# Patient Record
Sex: Female | Born: 1980 | Race: Black or African American | Hispanic: No | Marital: Single | Smoking: Current every day smoker
Health system: Southern US, Community
[De-identification: ages and names within clinical notes are randomized; demographics above are authoritative.]

## PROBLEM LIST (undated history)

## (undated) DIAGNOSIS — R079 Chest pain, unspecified: Secondary | ICD-10-CM

## (undated) DIAGNOSIS — E039 Hypothyroidism, unspecified: Secondary | ICD-10-CM

## (undated) DIAGNOSIS — F32A Depression, unspecified: Secondary | ICD-10-CM

## (undated) DIAGNOSIS — T7840XA Allergy, unspecified, initial encounter: Secondary | ICD-10-CM

## (undated) DIAGNOSIS — E079 Disorder of thyroid, unspecified: Secondary | ICD-10-CM

## (undated) DIAGNOSIS — N189 Chronic kidney disease, unspecified: Secondary | ICD-10-CM

## (undated) DIAGNOSIS — J45909 Unspecified asthma, uncomplicated: Secondary | ICD-10-CM

## (undated) DIAGNOSIS — E785 Hyperlipidemia, unspecified: Secondary | ICD-10-CM

## (undated) HISTORY — DX: Hyperlipidemia, unspecified: E78.5

## (undated) HISTORY — DX: Allergy, unspecified, initial encounter: T78.40XA

## (undated) HISTORY — DX: Chronic kidney disease, unspecified: N18.9

## (undated) HISTORY — DX: Hypothyroidism, unspecified: E03.9

## (undated) HISTORY — PX: TUBAL LIGATION: SHX77

## (undated) HISTORY — DX: Depression, unspecified: F32.A

## (undated) HISTORY — DX: Chest pain, unspecified: R07.9

---

## 2008-05-15 DIAGNOSIS — E079 Disorder of thyroid, unspecified: Secondary | ICD-10-CM | POA: Insufficient documentation

## 2017-10-20 ENCOUNTER — Ambulatory Visit: Payer: Self-pay | Admitting: Nurse Practitioner

## 2017-10-20 ENCOUNTER — Encounter: Payer: Self-pay | Admitting: Nurse Practitioner

## 2017-10-20 VITALS — BP 100/64 | HR 85 | Temp 98.3°F | Wt 128.6 lb

## 2017-10-20 DIAGNOSIS — N926 Irregular menstruation, unspecified: Secondary | ICD-10-CM

## 2017-10-20 DIAGNOSIS — Z760 Encounter for issue of repeat prescription: Secondary | ICD-10-CM

## 2017-10-20 MED ORDER — BUDESONIDE-FORMOTEROL FUMARATE 160-4.5 MCG/ACT IN AERO: 2.0000 | INHALATION_SPRAY | Freq: Two times a day (BID) | RESPIRATORY_TRACT | 2 refills | Status: DC

## 2017-10-20 MED ORDER — ALBUTEROL SULFATE HFA 108 (90 BASE) MCG/ACT IN AERS: 2.0000 | INHALATION_SPRAY | Freq: Four times a day (QID) | RESPIRATORY_TRACT | 2 refills | Status: DC | PRN

## 2017-10-20 NOTE — Patient Instructions (Addendum)

## 2017-10-20 NOTE — Progress Notes (Signed)
Asthma: Patient presents for evaluation of wheezing.  The patient has been previously diagnosed with asthma. Symptoms currently include wheezing and at night per patient and occur daily.  Observed precipitants include no identifiable factor.  Current limitations in activity from asthma: none.  Number of days of school or work missed in the last month: not applicable. Patient states she moved to the area 8 months ago and had not found a PCP at this time.  Patient presents for refills on maintenance medication and rescue inhaler.  Patient has not had any recent hospitalizations.   Does she do nebulizer treatments? yes Does she use an inhaler? yes Does she use a spacer w/MDIs? no Does she monitor peak flow rates? no  What is her personal best peak flow rate: n/a   Amenorrhea: Patient complains of amenorrhea. Currently periods are occurring irregularly.  Bleeding is moderate.  Patient states she missed her cycle for February, but had a period during the entire month of January.  Patient has no relevant history of abnormal sexual development. Is there a chance of pregnancy? yes   Factors that may be contributory to menstrual abnormalities include recent stressors and history of thyroid disease..   Review of Systems  Constitutional: Negative.   HENT: Negative.   Eyes: Negative.   Respiratory: Positive for wheezing. Negative for cough and shortness of breath.   Cardiovascular: Negative.   Gastrointestinal: Negative.        Amenorrhea for February  Genitourinary: Negative.   Skin: Negative.     Physical Exam  Constitutional: She is oriented to person, place, and time. She appears well-developed and well-nourished.  HENT:  Head: Normocephalic and atraumatic.  Eyes: Conjunctivae and EOM are normal. Pupils are equal, round, and reactive to light.  Neck: Normal range of motion. Neck supple. No tracheal deviation present. No thyromegaly present.  Cardiovascular: Normal rate, regular rhythm and normal  heart sounds.  Pulmonary/Chest: Effort normal and breath sounds normal. No respiratory distress. She has no wheezes.  Abdominal: Soft. Bowel sounds are normal. She exhibits no distension. There is no tenderness.  Neurological: She is alert and oriented to person, place, and time.  Skin: Skin is warm and dry.  Psychiatric: She has a normal mood and affect. Her behavior is normal. Judgment and thought content normal.    Assessment   1. Asthma- Medication Refill Meds ordered this encounter  Medications  . albuterol (PROVENTIL HFA;VENTOLIN HFA) 108 (90 Base) MCG/ACT inhaler    Sig: Inhale 2 puffs into the lungs every 6 (six) hours as needed for wheezing or shortness of breath.    Dispense:  1 Inhaler    Refill:  2    Order Specific Question:   Supervising Provider    Answer:   Stacie GlazeJENKINS, JOHN E [[id number]]  . budesonide-formoterol (SYMBICORT) 160-4.5 MCG/ACT inhaler    Sig: Inhale 2 puffs into the lungs 2 (two) times daily.    Dispense:  1 Inhaler    Refill:  2    Order Specific Question:   Supervising Provider    Answer:   Stacie GlazeJENKINS, JOHN E [[id number]]  Refill on asthma medications.  Patient instructed to establish with PCP for asthma action plan and thyroid disease, hyperlipidemia.    2.  Amenorrhea Urine preg-negative.  Patient instructed to follow up with GYN if menses becomes irregular.  Follow up as needed.

## 2017-11-05 ENCOUNTER — Encounter (HOSPITAL_COMMUNITY): Payer: Self-pay

## 2017-11-05 ENCOUNTER — Emergency Department (HOSPITAL_COMMUNITY): Payer: Self-pay

## 2017-11-05 ENCOUNTER — Emergency Department (HOSPITAL_COMMUNITY)
Admission: EM | Admit: 2017-11-05 | Discharge: 2017-11-05 | Disposition: A | Discharge status: Home / Self Care | Payer: Self-pay | Attending: Emergency Medicine | Admitting: Emergency Medicine

## 2017-11-05 ENCOUNTER — Other Ambulatory Visit: Payer: Self-pay

## 2017-11-05 DIAGNOSIS — R062 Wheezing: Secondary | ICD-10-CM | POA: Insufficient documentation

## 2017-11-05 DIAGNOSIS — E039 Hypothyroidism, unspecified: Secondary | ICD-10-CM | POA: Insufficient documentation

## 2017-11-05 DIAGNOSIS — R0789 Other chest pain: Secondary | ICD-10-CM | POA: Insufficient documentation

## 2017-11-05 DIAGNOSIS — J45909 Unspecified asthma, uncomplicated: Secondary | ICD-10-CM | POA: Insufficient documentation

## 2017-11-05 DIAGNOSIS — F17200 Nicotine dependence, unspecified, uncomplicated: Secondary | ICD-10-CM | POA: Insufficient documentation

## 2017-11-05 DIAGNOSIS — Z720 Tobacco use: Secondary | ICD-10-CM

## 2017-11-05 DIAGNOSIS — Z79899 Other long term (current) drug therapy: Secondary | ICD-10-CM | POA: Insufficient documentation

## 2017-11-05 HISTORY — DX: Disorder of thyroid, unspecified: E07.9

## 2017-11-05 HISTORY — DX: Unspecified asthma, uncomplicated: J45.909

## 2017-11-05 MED ORDER — PREDNISONE 10 MG (21) PO TBPK: ORAL_TABLET | Freq: Every day | ORAL | 0 refills | Status: DC

## 2017-11-05 MED ORDER — ALBUTEROL SULFATE HFA 108 (90 BASE) MCG/ACT IN AERS
2.0000 | INHALATION_SPRAY | Freq: Once | RESPIRATORY_TRACT | Status: AC
  Administered 2017-11-05: 2 via RESPIRATORY_TRACT
  Filled 2017-11-05: qty 6.7

## 2017-11-05 MED ORDER — LEVOCETIRIZINE DIHYDROCHLORIDE 5 MG PO TABS: 5.0000 mg | ORAL_TABLET | Freq: Every evening | ORAL | 0 refills | Status: DC

## 2017-11-05 MED ORDER — IPRATROPIUM-ALBUTEROL 0.5-2.5 (3) MG/3ML IN SOLN
3.0000 mL | Freq: Once | RESPIRATORY_TRACT | Status: AC
  Administered 2017-11-05: 3 mL via RESPIRATORY_TRACT
  Filled 2017-11-05: qty 3

## 2017-11-05 NOTE — ED Triage Notes (Signed)
PT reports hx of asthma and feeling more sob over the last 2 weeks. Pt reports she went to [facility] to get [facility] on 2 inhalers but was only able to get rescue inhaler and is now out of that. NAD VSS

## 2017-11-05 NOTE — ED Provider Notes (Signed)
[facility] EMERGENCY DEPARTMENT Provider Note   CSN: [id number] Arrival date & time: 11/05/17  1314     History   Chief Complaint Chief Complaint  Patient presents with  . Asthma    HPI Tammy Miller is a 37 y.o. female.  Who presents emergency.  Patient is uninsured and new to town.  [facility] because she is been having worsening coughing, wheezing, chest tightness.  She used her entire albuterol inhaler between her visit 2 weeks ago and now.  She states that she does have seasonal allergies.  She admits to being a daily smoker.  She used to be on Symbicort when she was insured.  She denies fevers or chills  HPI  Past Medical History:  Diagnosis Date  . Asthma   . Thyroid disease     There are no active problems to display for this patient.   Past Surgical History:  Procedure Laterality Date  . CESAREAN SECTION    . TUBAL LIGATION      OB History    No data available       Home Medications    Prior to Admission medications   Medication Sig Start Date End Date Taking? Authorizing Provider  albuterol (PROVENTIL HFA;VENTOLIN HFA) 108 (90 Base) MCG/ACT inhaler Inhale 2 puffs into the lungs every 6 (six) hours as needed for wheezing or shortness of breath. 10/20/17 11/19/17 Yes Benay Pike, NP  budesonide-formoterol (SYMBICORT) 160-4.5 MCG/ACT inhaler Inhale 2 puffs into the lungs 2 (two) times daily. 10/20/17 11/19/17 Yes Benay Pike, NP  cetirizine (ZYRTEC) 10 MG tablet Take 10 mg by mouth daily.   Yes [provider]  montelukast (SINGULAIR) 10 MG tablet Take 10 mg by mouth at bedtime.   Yes [provider]  QUEtiapine (SEROQUEL) 100 MG tablet Take 100 mg by mouth at bedtime.   Yes [provider]  rosuvastatin (CRESTOR) 5 MG tablet Take 5 mg by mouth daily.   Yes [provider]  thyroid (ARMOUR) 120 MG tablet Take 120 mg by mouth daily before breakfast.   Yes  [provider]  levocetirizine (XYZAL) 5 MG tablet Take 1 tablet (5 mg total) by mouth every evening. 11/05/17   Vonzella Althaus, PA-C  predniSONE (STERAPRED UNI-PAK 21 TAB) 10 MG (21) TBPK tablet Take by mouth daily. Take 6 tabs by mouth daily  for 2 days, then 5 tabs for 2 days, then 4 tabs for 2 days, then 3 tabs for 2 days, 2 tabs for 2 days, then 1 tab by mouth daily for 2 days 11/05/17   Arthor Captain, PA-C    Family History No family history on file.  Social History Social History   Tobacco Use  . Smoking status: Current Every Day Smoker  . Smokeless tobacco: Never Used  Substance Use Topics  . Alcohol use: No    Frequency: Never  . Drug use: No     Allergies   Fish allergy; Aspirin; and Penicillins   Review of Systems Review of Systems  Ten systems reviewed and are negative for acute change, except as noted in the HPI.   Physical Exam Updated Vital Signs BP 115/86 (BP Location: Left Arm)   Pulse 93   Temp 98 F (36.7 C) (Oral)   Resp 15   Ht 5\' 4"  (1.626 m)   Wt 58.1 kg (128 lb)   LMP 10/24/2017   SpO2 100%   BMI 21.97 kg/m  Physical Exam  Constitutional: She is oriented to person, place, and time. She appears well-developed and well-nourished. No distress.  HENT:  Head: Normocephalic and atraumatic.  Eyes: Conjunctivae are normal. No scleral icterus.  Neck: Normal range of motion.  Cardiovascular: Normal rate, regular rhythm and normal heart sounds. Exam reveals no gallop and no friction rub.  No murmur heard. Pulmonary/Chest: Effort normal and breath sounds normal. No respiratory distress.  Abdominal: Soft. Bowel sounds are normal. She exhibits no distension and no mass. There is no tenderness. There is no guarding.  Neurological: She is alert and oriented to person, place, and time.  Skin: Skin is warm and dry. She is not diaphoretic.  Psychiatric: Her behavior is normal.  Nursing note and vitals reviewed.    ED Treatments / Results    Labs (all labs ordered are listed, but only abnormal results are displayed) Labs Reviewed - No data to display  EKG  EKG Interpretation None       Radiology Dg Chest 2 View  Result Date: 11/05/2017 CLINICAL DATA:  Shortness of breath and cough for 2 days. EXAM: CHEST - 2 VIEW COMPARISON:  None. FINDINGS: The cardiac silhouette, mediastinal and hilar contours are within normal limits. There is mild tortuosity of the thoracic aorta. The lungs are clear of acute process. No worrisome pulmonary lesions. No pleural effusion. The bony thorax is intact. IMPRESSION: No acute cardiopulmonary findings. Electronically Signed   By: Rudie MeyerP.  Gallerani M.D.   On: 11/05/2017 14:26    Procedures Procedures (including critical care time)  Medications Ordered in ED Medications  albuterol (PROVENTIL HFA;VENTOLIN HFA) 108 (90 Base) MCG/ACT inhaler 2 puff (not administered)  ipratropium-albuterol (DUONEB) 0.5-2.5 (3) MG/3ML nebulizer solution 3 mL (3 mLs Nebulization Given 11/05/17 1513)     Initial Impression / Assessment and Plan / ED Course  I have reviewed the triage vital signs and the nursing notes.  Pertinent labs & imaging results that were available during my care of the patient were reviewed by me and considered in my medical decision making (see chart for details).     Patient with asthma.  She was given a DuoNeb treatment prior to my evaluation and there is no wheezing on exam at this time.  Patient was discharged with a prednisone Dosepak, albuterol inhaler and Xyzal for allergy treatment.  She appears appropriate for discharge at this time should follow-up with outpatient primary care physician.  Discussed return precautions.  . The patient was counseled on the dangers of tobacco use, and was advised to quit.  Reviewed strategies to maximize success, including removing cigarettes and smoking materials from environment, stress management, substitution of other forms of reinforcement, support  of family/friends and written materials.  however he thinks  Final Clinical Impressions(s) / ED Diagnoses   Final diagnoses:  Wheezing    ED Discharge Orders        Ordered    predniSONE (STERAPRED UNI-PAK 21 TAB) 10 MG (21) TBPK tablet  Daily     11/05/17 1619    levocetirizine (XYZAL) 5 MG tablet  Every evening     11/05/17 1620       Arthor CaptainHarris, Seichi Kaufhold, PA-C 11/05/17 1651    Raeford RazorKohut, Stephen, MD 11/05/17 [phone]

## 2017-11-05 NOTE — ED Notes (Signed)
Declined W/C at D/C and was escorted to lobby by RN. 

## 2017-11-05 NOTE — Discharge Instructions (Signed)
Get help right away if: °You seem to be getting worse and are unresponsive to treatment during an asthma attack. °You are short of breath even at rest. °You get short of breath when doing very little physical activity. °You have difficulty eating, drinking, or talking due to asthma symptoms. °You develop chest pain. °You develop a fast heartbeat. °You have a bluish color to your lips or fingernails. °You are light-headed, dizzy, or faint. °Your peak flow is less than 50% of your personal best. °

## 2018-01-30 ENCOUNTER — Other Ambulatory Visit: Payer: Self-pay | Admitting: Nurse Practitioner

## 2018-07-19 IMAGING — CR DG CHEST 2V
2 series · 2 of 2 positions shown · non-contrast
Comparison: None.

CLINICAL DATA: Shortness of breath and cough for 2 days.

EXAM:
CHEST - 2 VIEW

[chest pa]
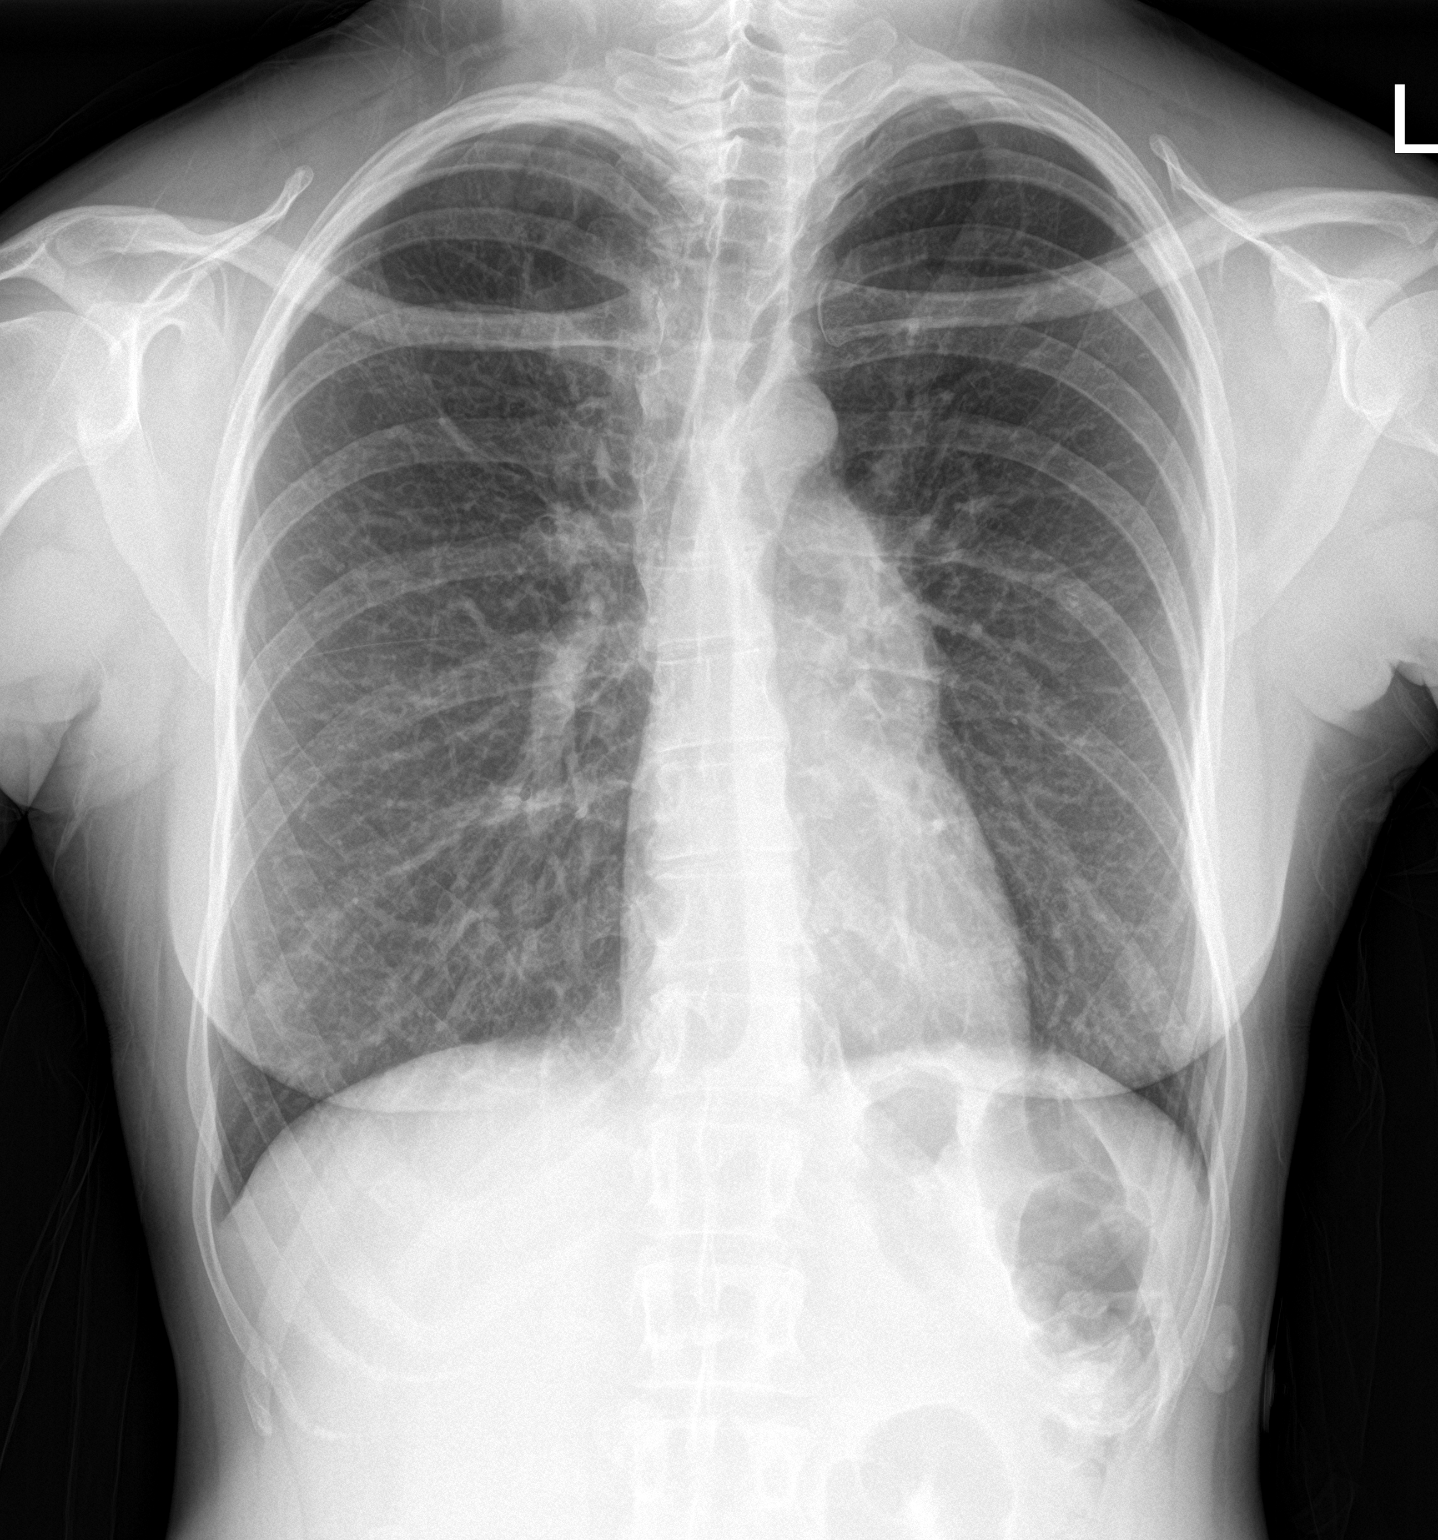

[chest lat]
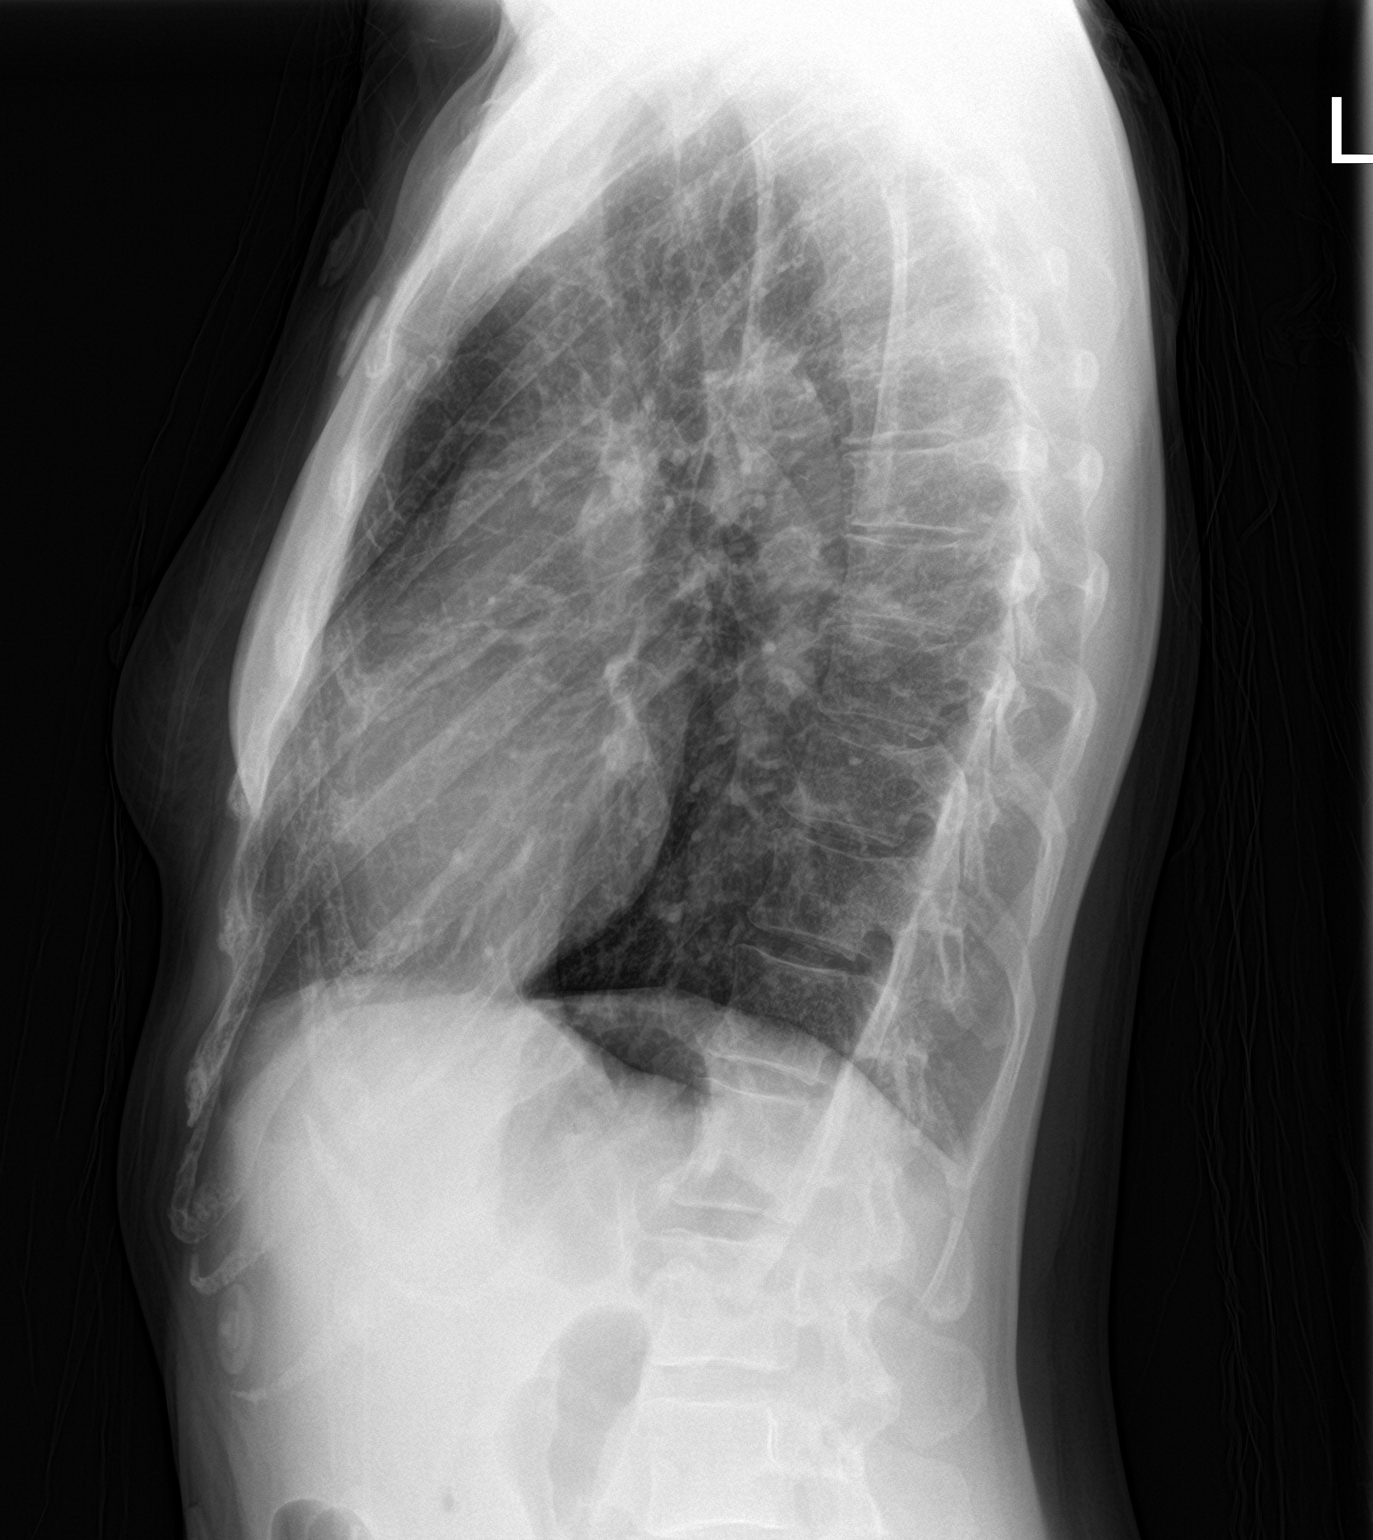

[2 of 2 positions shown; findings below may reference images not displayed]

FINDINGS: The cardiac silhouette, mediastinal and hilar contours are within
normal limits. There is mild tortuosity of the thoracic aorta. The
lungs are clear of acute process. No worrisome pulmonary lesions. No
pleural effusion. The bony thorax is intact.
IMPRESSION: No acute cardiopulmonary findings.

## 2019-12-04 ENCOUNTER — Emergency Department (HOSPITAL_COMMUNITY)
Admission: EM | Admit: 2019-12-04 | Discharge: 2019-12-04 | Disposition: A | Discharge status: Home / Self Care | Payer: Self-pay | Attending: Emergency Medicine | Admitting: Emergency Medicine

## 2019-12-04 ENCOUNTER — Encounter (HOSPITAL_COMMUNITY): Payer: Self-pay

## 2019-12-04 ENCOUNTER — Other Ambulatory Visit: Payer: Self-pay

## 2019-12-04 DIAGNOSIS — J45909 Unspecified asthma, uncomplicated: Secondary | ICD-10-CM | POA: Insufficient documentation

## 2019-12-04 DIAGNOSIS — K0889 Other specified disorders of teeth and supporting structures: Secondary | ICD-10-CM | POA: Insufficient documentation

## 2019-12-04 DIAGNOSIS — F17200 Nicotine dependence, unspecified, uncomplicated: Secondary | ICD-10-CM | POA: Insufficient documentation

## 2019-12-04 MED ORDER — CLINDAMYCIN HCL 150 MG PO CAPS: 150.0000 mg | ORAL_CAPSULE | Freq: Four times a day (QID) | ORAL | 0 refills | Status: DC

## 2019-12-04 MED ORDER — NAPROXEN 375 MG PO TABS: 375.0000 mg | ORAL_TABLET | Freq: Two times a day (BID) | ORAL | 0 refills | Status: DC | PRN

## 2019-12-04 NOTE — ED Triage Notes (Signed)
Pt reports left upper dental pain with swelling for the past 3 days.

## 2019-12-11 NOTE — ED Provider Notes (Signed)
[facility] EMERGENCY DEPARTMENT Provider Note   CSN: [id number] Arrival date & time: 12/04/19  2440     History Chief Complaint  Patient presents with  . Dental Pain    Tammy Miller is a 39 y.o. female.  HPI   39 year old female with facial/dental pain.  And aggravating her about 3 days ago.  Denies any trauma.  Persistent pain in her upper mouth going towards her left eye.  Mild swelling.  She says she knows she needs to see a dentist but is having some financial difficulties.  No fevers or chills.  No difficulty with breathing or swallowing.  Past Medical History:  Diagnosis Date  . Asthma   . Thyroid disease     There are no problems to display for this patient.   Past Surgical History:  Procedure Laterality Date  . CESAREAN SECTION    . TUBAL LIGATION       OB History   No obstetric history on file.     No family history on file.  Social History   Tobacco Use  . Smoking status: Current Every Day Smoker  . Smokeless tobacco: Never Used  Substance Use Topics  . Alcohol use: No  . Drug use: No    Home Medications Prior to Admission medications   Medication Sig Start Date End Date Taking? Authorizing Provider  albuterol (PROVENTIL HFA;VENTOLIN HFA) 108 (90 Base) MCG/ACT inhaler Inhale 2 puffs into the lungs every 6 (six) hours as needed for wheezing or shortness of breath. 10/20/17 11/19/17  Kara Dies, NP  budesonide-formoterol (SYMBICORT) 160-4.5 MCG/ACT inhaler Inhale 2 puffs into the lungs 2 (two) times daily. 10/20/17 11/19/17  Kara Dies, NP  cetirizine (ZYRTEC) 10 MG tablet Take 10 mg by mouth daily.    [provider]  clindamycin (CLEOCIN) 150 MG capsule Take 1 capsule (150 mg total) by mouth 4 (four) times daily. 12/04/19   Virgel Manifold, MD  levocetirizine (XYZAL) 5 MG tablet Take 1 tablet (5 mg total) by mouth every evening. 11/05/17   Harris, Abigail, PA-C  montelukast (SINGULAIR) 10 MG tablet Take  10 mg by mouth at bedtime.    [provider]  naproxen (NAPROSYN) 375 MG tablet Take 1 tablet (375 mg total) by mouth 2 (two) times daily as needed. 12/04/19   Virgel Manifold, MD  predniSONE (STERAPRED UNI-PAK 21 TAB) 10 MG (21) TBPK tablet Take by mouth daily. Take 6 tabs by mouth daily  for 2 days, then 5 tabs for 2 days, then 4 tabs for 2 days, then 3 tabs for 2 days, 2 tabs for 2 days, then 1 tab by mouth daily for 2 days 11/05/17   Margarita Mail, PA-C  QUEtiapine (SEROQUEL) 100 MG tablet Take 100 mg by mouth at bedtime.    [provider]  rosuvastatin (CRESTOR) 5 MG tablet Take 5 mg by mouth daily.    [provider]  thyroid (ARMOUR) 120 MG tablet Take 120 mg by mouth daily before breakfast.    [provider]    Allergies    Fish allergy, Aspirin, and Penicillins  Review of Systems   Review of Systems All systems reviewed and negative, other than as noted in HPI.  Physical Exam Updated Vital Signs BP (!) 126/94   Pulse 73   Temp 98.1 F (36.7 C) (Oral)   Resp 16   Ht 5\' 4"  (1.626 m)   Wt 63.5 kg   LMP 11/20/2019   SpO2 99%  BMI 24.03 kg/m   Physical Exam Vitals and nursing note reviewed.  Constitutional:      General: She is not in acute distress.    Appearance: She is well-developed.  HENT:     Head: Normocephalic and atraumatic.     Comments: Poor dentition.  Left upper lateral incisor and canine decayed.  Tender to percussion.  Mild tenderness to palpation of the adjacent maxilla.  No appreciable swelling noted.  Handling secretions.  Normal sounding voice.  Neck is supple.  No discrete drainable collection. Eyes:     General:        Right eye: No discharge.        Left eye: No discharge.     Conjunctiva/sclera: Conjunctivae normal.  Cardiovascular:     Rate and Rhythm: Normal rate and regular rhythm.     Heart sounds: Normal heart sounds. No murmur. No friction rub. No gallop.   Pulmonary:     Effort: Pulmonary effort is  normal. No respiratory distress.     Breath sounds: Normal breath sounds.  Abdominal:     General: There is no distension.     Palpations: Abdomen is soft.     Tenderness: There is no abdominal tenderness.  Musculoskeletal:        General: No tenderness.     Cervical back: Neck supple.  Skin:    General: Skin is warm and dry.  Neurological:     Mental Status: She is alert.  Psychiatric:        Behavior: Behavior normal.        Thought Content: Thought content normal.     ED Results / Procedures / Treatments   Labs (all labs ordered are listed, but only abnormal results are displayed) Labs Reviewed - No data to display  EKG None  Radiology No results found.  Procedures Procedures (including critical care time)  Medications Ordered in ED Medications - No data to display  ED Course  I have reviewed the triage vital signs and the nursing notes.  Pertinent labs & imaging results that were available during my care of the patient were reviewed by me and considered in my medical decision making (see chart for details).    MDM Rules/Calculators/A&P                     39 year old female with dental pain.  Needs definitive dental care.  Will place on antibiotics.  As needed NSAIDs.  Return precautions discussed.  Dental resource list provided.  Final Clinical Impression(s) / ED Diagnoses Final diagnoses:  Pain, dental    Rx / DC Orders ED Discharge Orders         Ordered    clindamycin (CLEOCIN) 150 MG capsule  4 times daily     12/04/19 1027    naproxen (NAPROSYN) 375 MG tablet  2 times daily PRN     12/04/19 1027           Raeford Razor, MD 12/11/19 [phone]

## 2022-03-01 ENCOUNTER — Other Ambulatory Visit: Payer: Self-pay

## 2022-03-01 ENCOUNTER — Encounter (HOSPITAL_COMMUNITY): Payer: Self-pay | Admitting: *Deleted

## 2022-03-01 ENCOUNTER — Ambulatory Visit (HOSPITAL_COMMUNITY)
Admission: EM | Admit: 2022-03-01 | Discharge: 2022-03-01 | Disposition: A | Discharge status: Home / Self Care | Payer: BC Managed Care – PPO | Attending: Emergency Medicine | Admitting: Emergency Medicine

## 2022-03-01 DIAGNOSIS — Z3202 Encounter for pregnancy test, result negative: Secondary | ICD-10-CM

## 2022-03-01 LAB — POC URINE PREG, ED: Preg Test, Ur: NEGATIVE

## 2022-03-01 NOTE — Discharge Instructions (Signed)
Please return if you have any concerns.

## 2022-03-01 NOTE — ED Provider Notes (Signed)
[facility]    CSN: [id number] Arrival date & time: 03/01/22  1138     History   Chief Complaint Chief Complaint  Patient presents with   Possible Pregnancy    Took a home test. Test was positive with a very faint line. So I'm unsure if it's accurate. - Entered by patient    HPI Tammy Miller is a 41 y.o. female.  Presents for possible pregnancy.  Home test was faintly positive and she would like a test here.  Denies urgency, dysuria, hematuria, flank or back pain, fever, vaginal discharge, bleeding or spotting, abdominal pain, vomiting/diarrhea.   Reports cycles are regular. LMP 6/11  Past Medical History:  Diagnosis Date   Asthma    Thyroid disease     There are no problems to display for this patient.   Past Surgical History:  Procedure Laterality Date   CESAREAN SECTION     TUBAL LIGATION      OB History   No obstetric history on file.      Home Medications    Prior to Admission medications   Medication Sig Start Date End Date Taking? Authorizing Provider  albuterol (PROVENTIL HFA;VENTOLIN HFA) 108 (90 Base) MCG/ACT inhaler Inhale 2 puffs into the lungs every 6 (six) hours as needed for wheezing or shortness of breath. 10/20/17 11/19/17  Leath-Warren, Sadie Haber, NP  budesonide-formoterol (SYMBICORT) 160-4.5 MCG/ACT inhaler Inhale 2 puffs into the lungs 2 (two) times daily. 10/20/17 11/19/17  Leath-Warren, Sadie Haber, NP  cetirizine (ZYRTEC) 10 MG tablet Take 10 mg by mouth daily.    [provider]  clindamycin (CLEOCIN) 150 MG capsule Take 1 capsule (150 mg total) by mouth 4 (four) times daily. 12/04/19   Raeford Razor, MD  levocetirizine (XYZAL) 5 MG tablet Take 1 tablet (5 mg total) by mouth every evening. 11/05/17   Harris, Abigail, PA-C  montelukast (SINGULAIR) 10 MG tablet Take 10 mg by mouth at bedtime.    [provider]  naproxen (NAPROSYN) 375 MG tablet Take 1 tablet (375 mg total) by mouth 2 (two) times daily as needed.  12/04/19   Raeford Razor, MD  predniSONE (STERAPRED UNI-PAK 21 TAB) 10 MG (21) TBPK tablet Take by mouth daily. Take 6 tabs by mouth daily  for 2 days, then 5 tabs for 2 days, then 4 tabs for 2 days, then 3 tabs for 2 days, 2 tabs for 2 days, then 1 tab by mouth daily for 2 days 11/05/17   Arthor Captain, PA-C  QUEtiapine (SEROQUEL) 100 MG tablet Take 100 mg by mouth at bedtime.    [provider]  rosuvastatin (CRESTOR) 5 MG tablet Take 5 mg by mouth daily.    [provider]  thyroid (ARMOUR) 120 MG tablet Take 120 mg by mouth daily before breakfast.    [provider]    Family History History reviewed. No pertinent family history.  Social History Social History   Tobacco Use   Smoking status: Every Day   Smokeless tobacco: Never  Substance Use Topics   Alcohol use: No   Drug use: No     Allergies   Fish allergy, Aspirin, and Penicillins   Review of Systems Review of Systems   Physical Exam Triage Vital Signs ED Triage Vitals  Enc Vitals Group     BP 03/01/22 1303 119/89     Pulse Rate 03/01/22 1303 78     Resp 03/01/22 1303 18     Temp 03/01/22 1303 98.1  F (36.7 C)     Temp src --      SpO2 03/01/22 1303 100 %     Weight --      Height --      Head Circumference --      Peak Flow --      Pain Score 03/01/22 1300 0     Pain Loc --      Pain Edu? --      Excl. in GC? --    No data found.  Updated Vital Signs BP 119/89   Pulse 78   Temp 98.1 F (36.7 C)   Resp 18   LMP 01/30/2022   SpO2 100%    Physical Exam Vitals and nursing note reviewed.  Constitutional:      Appearance: Normal appearance.  HENT:     Mouth/Throat:     Pharynx: Oropharynx is clear.  Cardiovascular:     Rate and Rhythm: Normal rate and regular rhythm.     Pulses: Normal pulses.  Pulmonary:     Effort: Pulmonary effort is normal.  Musculoskeletal:        General: Normal range of motion.  Neurological:     Mental Status: She is alert and  oriented to person, place, and time.      UC Treatments / Results  Labs (all labs ordered are listed, but only abnormal results are displayed) Labs Reviewed  POC URINE PREG, ED    EKG   Radiology No results found.  Procedures Procedures (including critical care time)  Medications Ordered in UC Medications - No data to display  Initial Impression / Assessment and Plan / UC Course  I have reviewed the triage vital signs and the nursing notes.  Pertinent labs & imaging results that were available during my care of the patient were reviewed by me and considered in my medical decision making (see chart for details).  Urine pregnancy negative. Patient reports she is happy it's not positive.  No questions at this time. She can return with any concerns or for a repeat test if her menstrual cycle does not occur. Return precautions discussed. Patient agrees to plan and is discharged in stable condition.  Final Clinical Impressions(s) / UC Diagnoses   Final diagnoses:  Encounter for pregnancy test with result negative     Discharge Instructions      Please return if you have any concerns.     ED Prescriptions   None    PDMP not reviewed this encounter.   Korry Dalgleish, Ray Church 03/01/22 1356

## 2022-03-01 NOTE — ED Triage Notes (Signed)
Pt reports a positive pregnancy home test and wants to confirm.

## 2023-01-08 ENCOUNTER — Other Ambulatory Visit: Payer: Self-pay

## 2023-01-08 ENCOUNTER — Emergency Department (HOSPITAL_COMMUNITY): Payer: BC Managed Care – PPO

## 2023-01-08 ENCOUNTER — Encounter (HOSPITAL_COMMUNITY): Payer: Self-pay

## 2023-01-08 ENCOUNTER — Emergency Department (HOSPITAL_COMMUNITY)
Admission: EM | Admit: 2023-01-08 | Discharge: 2023-01-08 | Disposition: A | Discharge status: Home / Self Care | Payer: BC Managed Care – PPO | Attending: Emergency Medicine | Admitting: Emergency Medicine

## 2023-01-08 DIAGNOSIS — R0602 Shortness of breath: Secondary | ICD-10-CM | POA: Diagnosis not present

## 2023-01-08 DIAGNOSIS — J4541 Moderate persistent asthma with (acute) exacerbation: Secondary | ICD-10-CM | POA: Diagnosis not present

## 2023-01-08 DIAGNOSIS — Z7951 Long term (current) use of inhaled steroids: Secondary | ICD-10-CM | POA: Diagnosis not present

## 2023-01-08 DIAGNOSIS — R062 Wheezing: Secondary | ICD-10-CM | POA: Diagnosis not present

## 2023-01-08 LAB — CBC
HCT: 36.8 % (ref 36.0–46.0)
Hemoglobin: 11.9 g/dL — ABNORMAL LOW (ref 12.0–15.0)
MCH: 32.9 pg (ref 26.0–34.0)
MCHC: 32.3 g/dL (ref 30.0–36.0)
MCV: 101.7 fL — ABNORMAL HIGH (ref 80.0–100.0)
Platelets: 254 10*3/uL (ref 150–400)
RBC: 3.62 MIL/uL — ABNORMAL LOW (ref 3.87–5.11)
RDW: 14 % (ref 11.5–15.5)
WBC: 8.8 10*3/uL (ref 4.0–10.5)
nRBC: 0 % (ref 0.0–0.2)

## 2023-01-08 LAB — BASIC METABOLIC PANEL
Anion gap: 9 (ref 5–15)
BUN: 12 mg/dL (ref 6–20)
CO2: 25 mmol/L (ref 22–32)
Calcium: 9 mg/dL (ref 8.9–10.3)
Chloride: 103 mmol/L (ref 98–111)
Creatinine, Ser: 1.36 mg/dL — ABNORMAL HIGH (ref 0.44–1.00)
GFR, Estimated: 51 mL/min — ABNORMAL LOW (ref >60)
Glucose, Bld: 104 mg/dL — ABNORMAL HIGH (ref 70–99)
Potassium: 4.2 mmol/L (ref 3.5–5.1)
Sodium: 137 mmol/L (ref 135–145)

## 2023-01-08 LAB — TROPONIN I (HIGH SENSITIVITY)
Troponin I (High Sensitivity): 6 ng/L (ref <18)
Troponin I (High Sensitivity): 3 ng/L (ref <18)

## 2023-01-08 LAB — I-STAT BETA HCG BLOOD, ED (MC, WL, AP ONLY): I-stat hCG, quantitative: <5 m[IU]/mL (ref <5)

## 2023-01-08 MED ORDER — IPRATROPIUM-ALBUTEROL 0.5-2.5 (3) MG/3ML IN SOLN
3.0000 mL | Freq: Once | RESPIRATORY_TRACT | Status: AC
  Administered 2023-01-08: 3 mL via RESPIRATORY_TRACT
  Filled 2023-01-08: qty 3

## 2023-01-08 MED ORDER — ALBUTEROL SULFATE HFA 108 (90 BASE) MCG/ACT IN AERS
2.0000 | INHALATION_SPRAY | Freq: Once | RESPIRATORY_TRACT | Status: AC
  Administered 2023-01-08: 2 via RESPIRATORY_TRACT
  Filled 2023-01-08: qty 6.7

## 2023-01-08 MED ORDER — DEXAMETHASONE 4 MG PO TABS
8.0000 mg | ORAL_TABLET | Freq: Once | ORAL | Status: AC
  Administered 2023-01-08: 8 mg via ORAL
  Filled 2023-01-08: qty 2

## 2023-01-08 NOTE — ED Triage Notes (Signed)
Pt arrives with c/o SOB that started tonight. Per pt, she woke with SOB. Pt endorses CP. Pt used inhaler without relief.

## 2023-01-08 NOTE — ED Provider Notes (Signed)
[place] EMERGENCY DEPARTMENT AT [facility] Provider Note   CSN: [id number] Arrival date & time: 01/08/23  0050     History  Chief Complaint  Patient presents with   Shortness of Breath    Tammy Miller is a 42 y.o. female.  The history is provided by the patient.   Patient with history of asthma and thyroid disease presents with shortness of breath  Patient reports has had recent wheezing and chest tightness.  Tonight she woke up and was having coughing and shortness of breath.  No chest pain.  No fevers or vomiting.  No hemoptysis. She is still smoking.  She is unable to afford her medicines, she has been borrowing albuterol from her mother No previous admissions for asthma.  No recent long distance travel or surgery.  No history of VTE No new lower extremity pain or swelling Past Medical History:  Diagnosis Date   Asthma    Thyroid disease     Home Medications Prior to Admission medications   Medication Sig Start Date End Date Taking? Authorizing Provider  albuterol (PROVENTIL HFA;VENTOLIN HFA) 108 (90 Base) MCG/ACT inhaler Inhale 2 puffs into the lungs every 6 (six) hours as needed for wheezing or shortness of breath. 10/20/17 11/19/17  Leath-Warren, Sadie Haber, NP  budesonide-formoterol (SYMBICORT) 160-4.5 MCG/ACT inhaler Inhale 2 puffs into the lungs 2 (two) times daily. 10/20/17 11/19/17  Leath-Warren, Sadie Haber, NP  cetirizine (ZYRTEC) 10 MG tablet Take 10 mg by mouth daily.    [provider]  levocetirizine (XYZAL) 5 MG tablet Take 1 tablet (5 mg total) by mouth every evening. 11/05/17   Harris, Abigail, PA-C  montelukast (SINGULAIR) 10 MG tablet Take 10 mg by mouth at bedtime.    [provider]  QUEtiapine (SEROQUEL) 100 MG tablet Take 100 mg by mouth at bedtime.    [provider]  rosuvastatin (CRESTOR) 5 MG tablet Take 5 mg by mouth daily.    [provider]  thyroid (ARMOUR) 120 MG tablet Take 120 mg by mouth daily  before breakfast.    [provider]      Allergies    Fish allergy, Aspirin, and Penicillins    Review of Systems   Review of Systems  Constitutional:  Negative for fever.  Respiratory:  Positive for cough, shortness of breath and wheezing.   Cardiovascular:  Negative for leg swelling.    Physical Exam Updated Vital Signs BP 105/75 (BP Location: Right Arm)   Pulse 100   Temp 97.6 F (36.4 C)   Resp (!) 28   Wt 65.8 kg   SpO2 97%   BMI 24.89 kg/m  Physical Exam CONSTITUTIONAL: Well developed/well nourished HEAD: Normocephalic/atraumatic EYES: EOMI/PERRL ENMT: Mucous membranes moist, poor dentition NECK: supple no meningeal signs, no JVD SPINE/BACK:entire spine nontender CV: S1/S2 noted, no murmurs/rubs/gallops noted LUNGS: Scattered wheezing bilaterally.  No crackles No distress noted ABDOMEN: soft, nontender NEURO: Pt is awake/alert/appropriate, moves all extremitiesx4.  No facial droop.   EXTREMITIES: pulses normal/equal, full ROM, no lower extremity edema or tenderness SKIN: warm, color normal PSYCH: no abnormalities of mood noted, alert and oriented to situation  ED Results / Procedures / Treatments   Labs (all labs ordered are listed, but only abnormal results are displayed) Labs Reviewed  BASIC METABOLIC PANEL - Abnormal; Notable for the following components:      Result Value   Glucose, Bld 104 (*)    Creatinine, Ser 1.36 (*)    GFR, Estimated 51 (*)  All other components within normal limits  CBC - Abnormal; Notable for the following components:   RBC 3.62 (*)    Hemoglobin 11.9 (*)    MCV 101.7 (*)    All other components within normal limits  I-STAT BETA HCG BLOOD, ED ([facility], [facility], [facility] ONLY)  TROPONIN I (HIGH SENSITIVITY)  TROPONIN I (HIGH SENSITIVITY)    EKG EKG Interpretation  Date/Time:  Sunday Jan 08 2023 00:57:10 EDT Ventricular Rate:  93 PR Interval:  134 QRS Duration: 76 QT Interval:  360 QTC Calculation: 447 R  Axis:   90 Text Interpretation: Normal sinus rhythm Rightward axis Interpretation limited secondary to artifact No significant change since last tracing Confirmed by Zadie Rhine ([id number]) on 01/08/2023 3:09:26 AM  Radiology DG Chest 2 View  Result Date: 01/08/2023 CLINICAL DATA:  Shortness of breath EXAM: CHEST - 2 VIEW COMPARISON:  Chest radiographs 11/05/2017 FINDINGS: The heart size and mediastinal contours are within normal limits. Both lungs are clear. The visualized skeletal structures are unremarkable. IMPRESSION: No active cardiopulmonary disease. Electronically Signed   By: Minerva Fester M.D.   On: 01/08/2023 01:34    Procedures Procedures    Medications Ordered in ED Medications  ipratropium-albuterol (DUONEB) 0.5-2.5 (3) MG/3ML nebulizer solution 3 mL (3 mLs Nebulization Given 01/08/23 0100)  albuterol (VENTOLIN HFA) 108 (90 Base) MCG/ACT inhaler 2 puff (2 puffs Inhalation Given 01/08/23 0332)  dexamethasone (DECADRON) tablet 8 mg (8 mg Oral Given 01/08/23 5621)    ED Course/ Medical Decision Making/ A&P                             Medical Decision Making Amount and/or Complexity of Data Reviewed Labs: ordered. Radiology: ordered.  Risk Prescription drug management.   This patient presents to the ED for concern of shortness of breath, this involves an extensive number of treatment options, and is a complaint that carries with it a high risk of complications and morbidity.  The differential diagnosis includes but is not limited to Acute coronary syndrome, pneumonia, acute pulmonary edema, pneumothorax, acute anemia, pulmonary embolism, asthma exacerbation, bronchitis    Comorbidities that complicate the patient evaluation: Patient's presentation is complicated by their history of asthma  Social Determinants of Health: Patient's lack of prescription access and lack of primary care access, still smoking cigarettes   increases the complexity of managing their  presentation  Additional history obtained: Additional history obtained from significant other Lab Tests: I Ordered, and personally interpreted labs.  The pertinent results include: Mild renal insufficiency  Imaging Studies ordered: I ordered imaging studies including X-ray chest   I independently visualized and interpreted imaging which showed no acute findings I agree with the radiologist interpretation   Medicines ordered and prescription drug management: I ordered medication including albuterol and Decadron for wheezing   Reevaluation: After the interventions noted above, I reevaluated the patient and found that they have :improved  Complexity of problems addressed: Patient's presentation is most consistent with  acute presentation with potential threat to life or bodily function  Disposition: After consideration of the diagnostic results and the patient's response to treatment,  I feel that the patent would benefit from discharge   .    Patient well-appearing, already improving.  No signs of pneumonia or CHF.  Low suspicion for PE. she is in no respiratory distress on my exam. Counseled on smoking cessation. Given information for PCP.  Given albuterol at discharge We discussed strict return  precautions       Final Clinical Impression(s) / ED Diagnoses Final diagnoses:  Moderate persistent asthma with exacerbation    Rx / DC Orders ED Discharge Orders     None         Zadie Rhine, MD 01/08/23 [phone]

## 2023-01-08 NOTE — ED Notes (Signed)
Discharge instructions provided by edp were discussed with pt. Pt verbalized understanding with no questions at this time.

## 2023-01-13 ENCOUNTER — Encounter: Payer: Self-pay | Admitting: Nurse Practitioner

## 2023-01-13 ENCOUNTER — Ambulatory Visit (INDEPENDENT_AMBULATORY_CARE_PROVIDER_SITE_OTHER): Payer: BC Managed Care – PPO | Admitting: Nurse Practitioner

## 2023-01-13 VITALS — BP 120/87 | HR 87 | Temp 97.7°F | Ht 64.0 in | Wt 162.6 lb

## 2023-01-13 DIAGNOSIS — N289 Disorder of kidney and ureter, unspecified: Secondary | ICD-10-CM | POA: Diagnosis not present

## 2023-01-13 DIAGNOSIS — Z1329 Encounter for screening for other suspected endocrine disorder: Secondary | ICD-10-CM | POA: Diagnosis not present

## 2023-01-13 DIAGNOSIS — E079 Disorder of thyroid, unspecified: Secondary | ICD-10-CM

## 2023-01-13 DIAGNOSIS — J4540 Moderate persistent asthma, uncomplicated: Secondary | ICD-10-CM

## 2023-01-13 MED ORDER — BUDESONIDE-FORMOTEROL FUMARATE 160-4.5 MCG/ACT IN AERO: 2.0000 | INHALATION_SPRAY | Freq: Two times a day (BID) | RESPIRATORY_TRACT | 2 refills | Status: DC

## 2023-01-13 MED ORDER — MONTELUKAST SODIUM 10 MG PO TABS: 10.0000 mg | ORAL_TABLET | Freq: Every day | ORAL | 2 refills | Status: DC

## 2023-01-13 NOTE — Patient Instructions (Signed)
1. Thyroid disorder screen  - Thyroid Panel With TSH - will reorder thyroid medication if needed  2. Moderate persistent asthma, unspecified whether complicated  - budesonide-formoterol (SYMBICORT) 160-4.5 MCG/ACT inhaler; Inhale 2 puffs into the lungs 2 (two) times daily.  Dispense: 1 each; Refill: 2 - montelukast (SINGULAIR) 10 MG tablet; Take 1 tablet (10 mg total) by mouth at bedtime.  Dispense: 30 tablet; Refill: 2  Follow up:  Follow up in 3 months

## 2023-01-13 NOTE — Assessment & Plan Note (Signed)
-   Thyroid Panel With TSH - will reorder thyroid medication if needed  2. Moderate persistent asthma, unspecified whether complicated  - budesonide-formoterol (SYMBICORT) 160-4.5 MCG/ACT inhaler; Inhale 2 puffs into the lungs 2 (two) times daily.  Dispense: 1 each; Refill: 2 - montelukast (SINGULAIR) 10 MG tablet; Take 1 tablet (10 mg total) by mouth at bedtime.  Dispense: 30 tablet; Refill: 2  Follow up:  Follow up in 3 months

## 2023-01-13 NOTE — Progress Notes (Signed)
@Patient  ID: Tammy Miller, female    DOB: 1981/07/09, 42 y.o.   MRN: [id number]  Chief Complaint  Patient presents with   Hospitalization Follow-up    Establish and follow up on asthma    Referring provider: No ref. provider found   HPI  Patient presents today to establish care.  She was recently seen in the ED on 01/08/2023 for asthma flare.  Patient states that she has not seen a PCP in the past 7 years due to not having insurance.  She does have insurance now and would like to get reestablished.  Patient does have a history of thyroid disease but has not been taking medication.  She was given an inhaler for asthma in the ED.  We will restart her Symbicort and Singulair today.  We will check thyroid level and reorder medication as indicated. Denies f/c/s, n/v/d, hemoptysis, PND, leg swelling Denies chest pain or edema      Allergies  Allergen Reactions   Fish Allergy Anaphylaxis    Closes airway.    Aspirin    Penicillins      There is no immunization history on file for this patient.  Past Medical History:  Diagnosis Date   Asthma    Thyroid disease     Tobacco History: Social History   Tobacco Use  Smoking Status Every Day  Smokeless Tobacco Never   Ready to quit: Not Answered Counseling given: Not Answered   Outpatient Encounter Medications as of 01/13/2023  Medication Sig   cetirizine (ZYRTEC) 10 MG tablet Take 10 mg by mouth daily.   albuterol (PROVENTIL HFA;VENTOLIN HFA) 108 (90 Base) MCG/ACT inhaler Inhale 2 puffs into the lungs every 6 (six) hours as needed for wheezing or shortness of breath. (Patient not taking: Reported on 01/13/2023)   budesonide-formoterol (SYMBICORT) 160-4.5 MCG/ACT inhaler Inhale 2 puffs into the lungs 2 (two) times daily.   montelukast (SINGULAIR) 10 MG tablet Take 1 tablet (10 mg total) by mouth at bedtime.   QUEtiapine (SEROQUEL) 100 MG tablet Take 100 mg by mouth at bedtime. (Patient not taking: Reported on 01/13/2023)    thyroid (ARMOUR) 120 MG tablet Take 120 mg by mouth daily before breakfast. (Patient not taking: Reported on 01/13/2023)   [DISCONTINUED] budesonide-formoterol (SYMBICORT) 160-4.5 MCG/ACT inhaler Inhale 2 puffs into the lungs 2 (two) times daily. (Patient not taking: Reported on 01/13/2023)   [DISCONTINUED] levocetirizine (XYZAL) 5 MG tablet Take 1 tablet (5 mg total) by mouth every evening.   [DISCONTINUED] montelukast (SINGULAIR) 10 MG tablet Take 10 mg by mouth at bedtime. (Patient not taking: Reported on 01/13/2023)   [DISCONTINUED] rosuvastatin (CRESTOR) 5 MG tablet Take 5 mg by mouth daily.   No facility-administered encounter medications on file as of 01/13/2023.     Review of Systems  Review of Systems  Constitutional: Negative.   HENT: Negative.    Cardiovascular: Negative.   Gastrointestinal: Negative.   Allergic/Immunologic: Negative.   Neurological: Negative.   Psychiatric/Behavioral: Negative.         Physical Exam  BP 120/87   Pulse 87   Temp 97.7 F (36.5 C)   Ht 5\' 4"  (1.626 m)   Wt 162 lb 9.6 oz (73.8 kg)   SpO2 97%   BMI 27.91 kg/m   Wt Readings from Last 5 Encounters:  01/13/23 162 lb 9.6 oz (73.8 kg)  01/08/23 145 lb (65.8 kg)  12/04/19 140 lb (63.5 kg)  11/05/17 128 lb (58.1 kg)  10/20/17 128 lb 9.6 oz (58.3  kg)     Physical Exam Vitals and nursing note reviewed.  Constitutional:      General: She is not in acute distress.    Appearance: She is well-developed.  Cardiovascular:     Rate and Rhythm: Normal rate and regular rhythm.  Pulmonary:     Effort: Pulmonary effort is normal.     Breath sounds: Normal breath sounds.  Neurological:     Mental Status: She is alert and oriented to person, place, and time.      Lab Results:  CBC    Component Value Date/Time   WBC 8.8 01/08/2023 0108   RBC 3.62 (L) 01/08/2023 0108   HGB 11.9 (L) 01/08/2023 0108   HCT 36.8 01/08/2023 0108   PLT 254 01/08/2023 0108   MCV 101.7 (H) 01/08/2023 0108    MCH 32.9 01/08/2023 0108   MCHC 32.3 01/08/2023 0108   RDW 14.0 01/08/2023 0108    BMET    Component Value Date/Time   NA 137 01/08/2023 0108   K 4.2 01/08/2023 0108   CL 103 01/08/2023 0108   CO2 25 01/08/2023 0108   GLUCOSE 104 (H) 01/08/2023 0108   BUN 12 01/08/2023 0108   CREATININE 1.36 (H) 01/08/2023 0108   CALCIUM 9.0 01/08/2023 0108   GFRNONAA 51 (L) 01/08/2023 0108    BNP No results found for: "BNP"  ProBNP No results found for: "PROBNP"  Imaging: DG Chest 2 View  Result Date: 01/08/2023 CLINICAL DATA:  Shortness of breath EXAM: CHEST - 2 VIEW COMPARISON:  Chest radiographs 11/05/2017 FINDINGS: The heart size and mediastinal contours are within normal limits. Both lungs are clear. The visualized skeletal structures are unremarkable. IMPRESSION: No active cardiopulmonary disease. Electronically Signed   By: Minerva Fester M.D.   On: 01/08/2023 01:34     Assessment & Plan:   Thyroid disease - Thyroid Panel With TSH - will reorder thyroid medication if needed  2. Moderate persistent asthma, unspecified whether complicated  - budesonide-formoterol (SYMBICORT) 160-4.5 MCG/ACT inhaler; Inhale 2 puffs into the lungs 2 (two) times daily.  Dispense: 1 each; Refill: 2 - montelukast (SINGULAIR) 10 MG tablet; Take 1 tablet (10 mg total) by mouth at bedtime.  Dispense: 30 tablet; Refill: 2  Follow up:  Follow up in 3 months     Ivonne Andrew, NP 01/13/2023

## 2023-01-14 LAB — THYROID PANEL WITH TSH
Free Thyroxine Index: 0.1 — ABNORMAL LOW (ref 1.2–4.9)
T3 Uptake Ratio: 14 % — ABNORMAL LOW (ref 24–39)
T4, Total: 0.8 ug/dL — CL (ref 4.5–12.0)
TSH: 57.5 u[IU]/mL — ABNORMAL HIGH (ref 0.450–4.500)

## 2023-01-18 ENCOUNTER — Other Ambulatory Visit: Payer: Self-pay | Admitting: Nurse Practitioner

## 2023-01-18 DIAGNOSIS — E079 Disorder of thyroid, unspecified: Secondary | ICD-10-CM

## 2023-01-18 MED ORDER — THYROID 120 MG PO TABS: 120.0000 mg | ORAL_TABLET | Freq: Every day | ORAL | 2 refills | Status: DC

## 2023-03-06 ENCOUNTER — Other Ambulatory Visit: Payer: Self-pay

## 2023-03-06 DIAGNOSIS — E079 Disorder of thyroid, unspecified: Secondary | ICD-10-CM

## 2023-03-15 ENCOUNTER — Other Ambulatory Visit: Payer: Self-pay | Admitting: Nurse Practitioner

## 2023-03-15 DIAGNOSIS — J4540 Moderate persistent asthma, uncomplicated: Secondary | ICD-10-CM

## 2023-03-15 MED ORDER — BUDESONIDE-FORMOTEROL FUMARATE 160-4.5 MCG/ACT IN AERO: 2.0000 | INHALATION_SPRAY | Freq: Two times a day (BID) | RESPIRATORY_TRACT | 2 refills | Status: DC

## 2023-04-04 ENCOUNTER — Other Ambulatory Visit (INDEPENDENT_AMBULATORY_CARE_PROVIDER_SITE_OTHER): Payer: No Typology Code available for payment source

## 2023-04-04 DIAGNOSIS — E079 Disorder of thyroid, unspecified: Secondary | ICD-10-CM

## 2023-04-04 LAB — TSH: TSH: 0.39 u[IU]/mL (ref 0.35–5.50)

## 2023-04-04 LAB — T4, FREE: Free T4: 0.86 ng/dL (ref 0.60–1.60)

## 2023-04-10 ENCOUNTER — Telehealth (INDEPENDENT_AMBULATORY_CARE_PROVIDER_SITE_OTHER): Payer: No Typology Code available for payment source | Admitting: "Endocrinology

## 2023-04-10 DIAGNOSIS — E039 Hypothyroidism, unspecified: Secondary | ICD-10-CM

## 2023-04-10 NOTE — Progress Notes (Addendum)
The patient reports they are currently: Tammy Miller. I spent 6-7 minutes on the video with the patient on the date of service. I spent an additional 8 minutes on pre- and post-visit activities on the date of service.   The patient was physically located in [place] or a state in which I am permitted to provide care. The patient and/or parent/guardian understood that s/he may incur co-pays and cost sharing, and agreed to the telemedicine visit. The visit was reasonable and appropriate under the circumstances given the patient's presentation at the time.  The patient and/or parent/guardian has been advised of the potential risks and limitations of this mode of treatment (including, but not limited to, the absence of in-person examination) and has agreed to be treated using telemedicine. The patient's/patient's family's questions regarding telemedicine have been answered.   The patient and/or parent/guardian has also been advised to contact their provider's office for worsening conditions, and seek emergency medical treatment and/or call 911 if the patient deems either necessary.     Outpatient Endocrinology Note Tammy Edwards, MD  04/10/23   Tammy Miller 40-22-1982 [id number]  Referring Provider: Ivonne Andrew, NP Primary Care Provider: Ivonne Andrew, NP Subjective  No chief complaint on file.   Assessment & Plan  Diagnoses and all orders for this visit:  Acquired hypothyroidism -     T4, free; Future -     T3, free; Future -     TSH; Future    Tammy Miller is currently taking armour thyroid 120 mcg qd. Tried levothyroxine and synthroid in past. Per patient, synthroid wasn't working well and made her hair fall out. Patient is currently biochemically euthyroid.  Educated on thyroid axis.  Recommend the following: Take armour thyroid 120 mcg every morning.  Advised to take levothyroxine first thing in the morning on empty stomach and wait at least 30 minutes to 1 hour before  eating or drinking anything or taking any other medications. Space out levothyroxine by 4 hours from any acid reflux medication/fibrate/iron/calcium/multivitamin. Advised to take birth control pills and nutritional supplements in the evening. Repeat lab before next visit or sooner if symptoms of hyperthyroidism or hypothyroidism develop.  Notify us immediately in case of pregnancy/breastfeeding or significant weight gain or loss. Counseled on compliance and follow up needs.   I have reviewed current medications, nurse's notes, allergies, vital signs, past medical and surgical history, family medical history, and social history for this encounter. Counseled patient on symptoms, examination findings, lab findings, imaging results, treatment decisions and monitoring and prognosis. The patient understood the recommendations and agrees with the treatment plan. All questions regarding treatment plan were fully answered.   Return in about 6 weeks (around 05/24/2023) for visit + labs before next visit.   Tammy , MD  04/10/23   I have reviewed current medications, nurse's notes, allergies, vital signs, past medical and surgical history, family medical history, and social history for this encounter. Counseled patient on symptoms, examination findings, lab findings, imaging results, treatment decisions and monitoring and prognosis. The patient understood the recommendations and agrees with the treatment plan. All questions regarding treatment plan were fully answered.   History of Present Illness Tammy Miller is a 42 y.o. year old female who presents to our clinic with hypothyroidism diagnosed in 2008.    Took levothyroxine and synthroid in past Per patient, synthroid wasn't working well and made her hair fall out  On Armour thyroid 120 mg qam   Symptoms suggestive of HYPOTHYROIDISM:  fatigue No  weight gain No cold intolerance  Yes constipation  No  Symptoms suggestive of HYPERTHYROIDISM:   weight loss  No heat intolerance No hyperdefecation  No palpitations  No  Compressive symptoms:  dysphagia  No dysphonia  Yes positional dyspnea (especially with simultaneous arms elevation)  No  Smokes  Yes On biotin  No Personal history of head/neck surgery/irradiation  No  Physical Exam  There were no vitals taken for this visit. Constitutional: well developed, well nourished Head: normocephalic, atraumatic, no exophthalmos Eyes: sclera anicteric, no redness Neck: no thyromegaly, no thyroid tenderness; no nodules palpated Lungs: normal respiratory effort Neurology: alert and oriented, no fine hand tremor Skin: dry, no appreciable rashes Musculoskeletal: no appreciable defects Psychiatric: normal mood and affect  Allergies Allergies  Allergen Reactions   Fish Allergy Anaphylaxis    Closes airway.    Aspirin    Penicillins     Current Medications Patient's Medications  New Prescriptions   No medications on file  Previous Medications   ALBUTEROL (PROVENTIL HFA;VENTOLIN HFA) 108 (90 BASE) MCG/ACT INHALER    Inhale 2 puffs into the lungs every 6 (six) hours as needed for wheezing or shortness of breath.   BUDESONIDE-FORMOTEROL (SYMBICORT) 160-4.5 MCG/ACT INHALER    Inhale 2 puffs into the lungs 2 (two) times daily.   CETIRIZINE (ZYRTEC) 10 MG TABLET    Take 10 mg by mouth daily.   MONTELUKAST (SINGULAIR) 10 MG TABLET    Take 1 tablet (10 mg total) by mouth at bedtime.   QUETIAPINE (SEROQUEL) 100 MG TABLET    Take 100 mg by mouth at bedtime.   THYROID (ARMOUR) 120 MG TABLET    Take 1 tablet (120 mg total) by mouth daily before breakfast.  Modified Medications   No medications on file  Discontinued Medications   No medications on file    Past Medical History Past Medical History:  Diagnosis Date   Asthma    Thyroid disease     Past Surgical History Past Surgical History:  Procedure Laterality Date   CESAREAN SECTION     TUBAL LIGATION      Family  History family history is not on file.  Social History Social History   Socioeconomic History   Marital status: Single    Spouse name: Not on file   Number of children: Not on file   Years of education: Not on file   Highest education level: 12th grade  Occupational History   Not on file  Tobacco Use   Smoking status: Every Day   Smokeless tobacco: Never  Substance and Sexual Activity   Alcohol use: No   Drug use: No   Sexual activity: Not on file  Other Topics Concern   Not on file  Social History Narrative   Not on file   Social Determinants of Health   Financial Resource Strain: Medium Risk (01/10/2023)   Overall Financial Resource Strain (CARDIA)    Difficulty of Paying Living Expenses: Somewhat hard  Food Insecurity: No Food Insecurity (01/10/2023)   Hunger Vital Sign    Worried About Running Out of Food in the Last Year: Never true    Ran Out of Food in the Last Year: Never true  Transportation Needs: Unmet Transportation Needs (01/10/2023)   PRAPARE - Administrator, Civil Service (Medical): Yes    Lack of Transportation (Non-Medical): No  Physical Activity: Unknown (01/10/2023)   Exercise Vital Sign    Days of Exercise per Week: 0 days  Minutes of Exercise per Session: Not on file  Stress: Stress Concern Present (01/10/2023)   [employer] of Occupational Health - Occupational Stress Questionnaire    Feeling of Stress : To some extent  Social Connections: Socially Integrated (01/10/2023)   Social Connection and Isolation Panel [NHANES]    Frequency of Communication with Friends and Family: Three times a week    Frequency of Social Gatherings with Friends and Family: Twice a week    Attends Religious Services: More than 4 times per year    Active Member of Clubs or Organizations: Yes    Attends Banker Meetings: More than 4 times per year    Marital Status: Living with partner  Intimate Partner Violence: Not on file     Laboratory Investigations Lab Results  Component Value Date   TSH 0.39 04/04/2023   TSH 57.500 (H) 01/13/2023   FREET4 0.86 04/04/2023     No results found for: "TSI"   No components found for: "TRAB"   No results found for: "CHOL" No results found for: "HDL" No results found for: "LDLCALC" No results found for: "TRIG" No results found for: "CHOLHDL" Lab Results  Component Value Date   CREATININE 1.36 (H) 01/08/2023   No results found for: "GFR"    Component Value Date/Time   NA 137 01/08/2023 0108   K 4.2 01/08/2023 0108   CL 103 01/08/2023 0108   CO2 25 01/08/2023 0108   GLUCOSE 104 (H) 01/08/2023 0108   BUN 12 01/08/2023 0108   CREATININE 1.36 (H) 01/08/2023 0108   CALCIUM 9.0 01/08/2023 0108   GFRNONAA 51 (L) 01/08/2023 0108      Latest Ref Rng & Units 01/08/2023    1:08 AM  BMP  Glucose 70 - 99 mg/dL 161   BUN 6 - 20 mg/dL 12   Creatinine 0.96 - 1.00 mg/dL 0.45   Sodium 409 - 811 mmol/L 137   Potassium 3.5 - 5.1 mmol/L 4.2   Chloride 98 - 111 mmol/L 103   CO2 22 - 32 mmol/L 25   Calcium 8.9 - 10.3 mg/dL 9.0        Component Value Date/Time   WBC 8.8 01/08/2023 0108   RBC 3.62 (L) 01/08/2023 0108   HGB 11.9 (L) 01/08/2023 0108   HCT 36.8 01/08/2023 0108   PLT 254 01/08/2023 0108   MCV 101.7 (H) 01/08/2023 0108   MCH 32.9 01/08/2023 0108   MCHC 32.3 01/08/2023 0108   RDW 14.0 01/08/2023 0108      Parts of this note may have been dictated using voice recognition software. There may be variances in spelling and vocabulary which are unintentional. Not all errors are proofread. Please notify the Thereasa Parkin if any discrepancies are noted or if the meaning of any statement is not clear.

## 2023-04-12 ENCOUNTER — Other Ambulatory Visit: Payer: Self-pay | Admitting: Nurse Practitioner

## 2023-04-12 DIAGNOSIS — J4540 Moderate persistent asthma, uncomplicated: Secondary | ICD-10-CM

## 2023-04-13 ENCOUNTER — Telehealth: Payer: No Typology Code available for payment source | Admitting: Emergency Medicine

## 2023-04-13 DIAGNOSIS — R062 Wheezing: Secondary | ICD-10-CM

## 2023-04-13 NOTE — Progress Notes (Signed)
Because you are wheezing, I am worried about you, and I feel your condition warrants further evaluation. I recommend that you be seen in a face to face visit.   NOTE: There will be NO CHARGE for this eVisit   If you are having a true medical emergency please call 911.      For an urgent face to face visit, [facility] has eight urgent care centers for your convenience:   NEW!! [facility] at [facility] Get Driving Directions [phone] [address] [place], [address]    [facility] Center at [facility] Get Driving Directions [phone] [address] [place], [place] [address]   [facility] Center [facility]) Get Driving Directions [phone] [address] [place], [place] [address]  [facility] Center [facility] [place]) Get Driving Directions [phone] [address] [place],  [place]  [address]  [facility] Health Urgent Care Center [facility] - at [employer]  [phone] [address] [place],  [place] [address]   [facility] Health Urgent Care at [facility] Get Driving Directions [phone] [address] [place], [place] [address]   [facility] Urgent Care at [facility] Get Driving Directions  [phone] [address] [place], [place] [address]   [facility] Health Urgent Care at [facility] Directions [phone] [address] [place], [place] [address]  Your MyChart E-visit questionnaire answers were reviewed by a board certified advanced clinical practitioner to complete your personal care plan based on your specific symptoms.  Thank you for using e-Visits.

## 2023-04-17 ENCOUNTER — Encounter: Payer: Self-pay | Admitting: Nurse Practitioner

## 2023-04-17 ENCOUNTER — Ambulatory Visit (INDEPENDENT_AMBULATORY_CARE_PROVIDER_SITE_OTHER): Payer: No Typology Code available for payment source | Admitting: Nurse Practitioner

## 2023-04-17 VITALS — BP 104/80 | HR 80 | Ht 64.0 in | Wt 153.4 lb

## 2023-04-17 DIAGNOSIS — J069 Acute upper respiratory infection, unspecified: Secondary | ICD-10-CM

## 2023-04-17 DIAGNOSIS — R051 Acute cough: Secondary | ICD-10-CM | POA: Diagnosis not present

## 2023-04-17 DIAGNOSIS — J4540 Moderate persistent asthma, uncomplicated: Secondary | ICD-10-CM

## 2023-04-17 DIAGNOSIS — Z0000 Encounter for general adult medical examination without abnormal findings: Secondary | ICD-10-CM

## 2023-04-17 MED ORDER — AZITHROMYCIN 250 MG PO TABS
ORAL_TABLET | ORAL | 0 refills | Status: AC
Start: 2023-04-17 — End: 2023-04-22

## 2023-04-17 MED ORDER — GUAIFENESIN ER 600 MG PO TB12
600.0000 mg | ORAL_TABLET | Freq: Two times a day (BID) | ORAL | 0 refills | Status: AC
Start: 2023-04-17 — End: 2023-04-24

## 2023-04-17 MED ORDER — FLUTICASONE FUROATE-VILANTEROL 100-25 MCG/ACT IN AEPB
1.0000 | INHALATION_SPRAY | Freq: Every day | RESPIRATORY_TRACT | 11 refills | Status: DC
Start: 2023-04-17 — End: 2023-10-17

## 2023-04-17 NOTE — Patient Instructions (Addendum)
1. Moderate persistent asthma, unspecified whether complicated  - fluticasone furoate-vilanterol (BREO ELLIPTA) 100-25 MCG/ACT AEPB; Inhale 1 puff into the lungs daily.  Dispense: 1 each; Refill: 11  2. Upper respiratory tract infection, unspecified type  - guaiFENesin (MUCINEX) 600 MG 12 hr tablet; Take 1 tablet (600 mg total) by mouth 2 (two) times daily for 7 days.  Dispense: 14 tablet; Refill: 0 - azithromycin (ZITHROMAX) 250 MG tablet; Take 2 tablets on day 1, then 1 tablet daily on days 2 through 5  Dispense: 6 tablet; Refill: 0  3. Acute cough  - guaiFENesin (MUCINEX) 600 MG 12 hr tablet; Take 1 tablet (600 mg total) by mouth 2 (two) times daily for 7 days.  Dispense: 14 tablet; Refill: 0 - azithromycin (ZITHROMAX) 250 MG tablet; Take 2 tablets on day 1, then 1 tablet daily on days 2 through 5  Dispense: 6 tablet; Refill: 0  4. Routine adult health maintenance  - CBC - Comprehensive metabolic panel - Lipid Panel  Follow up:  Follow up in 3 months

## 2023-04-17 NOTE — Progress Notes (Signed)
@Patient  ID: Tammy Miller, female    DOB: Jan 13, 1981, 42 y.o.   MRN: [id number]  Chief Complaint  Patient presents with   Annual Exam   Wheezing    Referring provider: Ivonne Andrew, NP   HPI  Patient presents today for physical.  She does need alternative inhaler sent to pharmacy.  She has been using Symbicort but states that her insurance would not cover this now.  We will order Breo per pharmacy request.  Patient states that she has had a cough for the past couple weeks and wheezing.  We will trial Zithromax and Mucinex. Denies f/c/s, n/v/d, hemoptysis, PND, leg swelling Denies chest pain or edema     Allergies  Allergen Reactions   Fish Allergy Anaphylaxis    Closes airway.    Aspirin    Penicillins      There is no immunization history on file for this patient.  Past Medical History:  Diagnosis Date   Asthma    Thyroid disease     Tobacco History: Social History   Tobacco Use  Smoking Status Every Day  Smokeless Tobacco Never   Ready to quit: Not Answered Counseling given: Not Answered   Outpatient Encounter Medications as of 04/17/2023  Medication Sig   azithromycin (ZITHROMAX) 250 MG tablet Take 2 tablets on day 1, then 1 tablet daily on days 2 through 5   fluticasone furoate-vilanterol (BREO ELLIPTA) 100-25 MCG/ACT AEPB Inhale 1 puff into the lungs daily.   guaiFENesin (MUCINEX) 600 MG 12 hr tablet Take 1 tablet (600 mg total) by mouth 2 (two) times daily for 7 days.   montelukast (SINGULAIR) 10 MG tablet TAKE 1 TABLET(10 MG) BY MOUTH AT BEDTIME   thyroid (ARMOUR) 120 MG tablet Take 1 tablet (120 mg total) by mouth daily before breakfast.   albuterol (PROVENTIL HFA;VENTOLIN HFA) 108 (90 Base) MCG/ACT inhaler Inhale 2 puffs into the lungs every 6 (six) hours as needed for wheezing or shortness of breath. (Patient not taking: Reported on 01/13/2023)   cetirizine (ZYRTEC) 10 MG tablet Take 10 mg by mouth daily.   QUEtiapine (SEROQUEL) 100 MG tablet  Take 100 mg by mouth at bedtime. (Patient not taking: Reported on 01/13/2023)   [DISCONTINUED] budesonide-formoterol (SYMBICORT) 160-4.5 MCG/ACT inhaler Inhale 2 puffs into the lungs 2 (two) times daily.   No facility-administered encounter medications on file as of 04/17/2023.     Review of Systems  Review of Systems  Constitutional: Negative.   HENT: Negative.    Cardiovascular: Negative.   Gastrointestinal: Negative.   Allergic/Immunologic: Negative.   Neurological: Negative.   Psychiatric/Behavioral: Negative.         Physical Exam  BP 104/80 (BP Location: Right Arm, Patient Position: Sitting, Cuff Size: Normal)   Pulse 80   Ht 5\' 4"  (1.626 m)   Wt 153 lb 6.4 oz (69.6 kg)   SpO2 98%   BMI 26.33 kg/m   Wt Readings from Last 5 Encounters:  04/17/23 153 lb 6.4 oz (69.6 kg)  01/13/23 162 lb 9.6 oz (73.8 kg)  01/08/23 145 lb (65.8 kg)  12/04/19 140 lb (63.5 kg)  11/05/17 128 lb (58.1 kg)     Physical Exam Vitals and nursing note reviewed.  Constitutional:      General: She is not in acute distress.    Appearance: She is well-developed.  Cardiovascular:     Rate and Rhythm: Normal rate and regular rhythm.  Pulmonary:     Effort: Pulmonary effort is normal.  Breath sounds: Normal breath sounds.  Neurological:     Mental Status: She is alert and oriented to person, place, and time.      Lab Results:  CBC    Component Value Date/Time   WBC 8.8 01/08/2023 0108   RBC 3.62 (L) 01/08/2023 0108   HGB 11.9 (L) 01/08/2023 0108   HCT 36.8 01/08/2023 0108   PLT 254 01/08/2023 0108   MCV 101.7 (H) 01/08/2023 0108   MCH 32.9 01/08/2023 0108   MCHC 32.3 01/08/2023 0108   RDW 14.0 01/08/2023 0108    BMET    Component Value Date/Time   NA 137 01/08/2023 0108   K 4.2 01/08/2023 0108   CL 103 01/08/2023 0108   CO2 25 01/08/2023 0108   GLUCOSE 104 (H) 01/08/2023 0108   BUN 12 01/08/2023 0108   CREATININE 1.36 (H) 01/08/2023 0108   CALCIUM 9.0 01/08/2023  0108   GFRNONAA 51 (L) 01/08/2023 0108     Assessment & Plan:   Moderate persistent asthma - fluticasone furoate-vilanterol (BREO ELLIPTA) 100-25 MCG/ACT AEPB; Inhale 1 puff into the lungs daily.  Dispense: 1 each; Refill: 11  2. Upper respiratory tract infection, unspecified type  - guaiFENesin (MUCINEX) 600 MG 12 hr tablet; Take 1 tablet (600 mg total) by mouth 2 (two) times daily for 7 days.  Dispense: 14 tablet; Refill: 0 - azithromycin (ZITHROMAX) 250 MG tablet; Take 2 tablets on day 1, then 1 tablet daily on days 2 through 5  Dispense: 6 tablet; Refill: 0  3. Acute cough  - guaiFENesin (MUCINEX) 600 MG 12 hr tablet; Take 1 tablet (600 mg total) by mouth 2 (two) times daily for 7 days.  Dispense: 14 tablet; Refill: 0 - azithromycin (ZITHROMAX) 250 MG tablet; Take 2 tablets on day 1, then 1 tablet daily on days 2 through 5  Dispense: 6 tablet; Refill: 0  4. Routine adult health maintenance  - CBC - Comprehensive metabolic panel - Lipid Panel  Follow up:  Follow up in 3 months      Ivonne Andrew, NP 04/17/2023

## 2023-04-17 NOTE — Assessment & Plan Note (Signed)
-   fluticasone furoate-vilanterol (BREO ELLIPTA) 100-25 MCG/ACT AEPB; Inhale 1 puff into the lungs daily.  Dispense: 1 each; Refill: 11  2. Upper respiratory tract infection, unspecified type  - guaiFENesin (MUCINEX) 600 MG 12 hr tablet; Take 1 tablet (600 mg total) by mouth 2 (two) times daily for 7 days.  Dispense: 14 tablet; Refill: 0 - azithromycin (ZITHROMAX) 250 MG tablet; Take 2 tablets on day 1, then 1 tablet daily on days 2 through 5  Dispense: 6 tablet; Refill: 0  3. Acute cough  - guaiFENesin (MUCINEX) 600 MG 12 hr tablet; Take 1 tablet (600 mg total) by mouth 2 (two) times daily for 7 days.  Dispense: 14 tablet; Refill: 0 - azithromycin (ZITHROMAX) 250 MG tablet; Take 2 tablets on day 1, then 1 tablet daily on days 2 through 5  Dispense: 6 tablet; Refill: 0  4. Routine adult health maintenance  - CBC - Comprehensive metabolic panel - Lipid Panel  Follow up:  Follow up in 3 months

## 2023-04-17 NOTE — Progress Notes (Signed)
No depression/ SI

## 2023-04-18 LAB — CBC
Hematocrit: 44 % (ref 34.0–46.6)
Hemoglobin: 14.1 g/dL (ref 11.1–15.9)
MCH: 31.1 pg (ref 26.6–33.0)
MCHC: 32 g/dL (ref 31.5–35.7)
MCV: 97 fL (ref 79–97)
Platelets: 298 10*3/uL (ref 150–450)
RBC: 4.54 x10E6/uL (ref 3.77–5.28)
RDW: 11.9 % (ref 11.7–15.4)
WBC: 8.4 10*3/uL (ref 3.4–10.8)

## 2023-04-18 LAB — COMPREHENSIVE METABOLIC PANEL
ALT: 24 IU/L (ref 0–32)
AST: 20 IU/L (ref 0–40)
Albumin: 4.2 g/dL (ref 3.9–4.9)
Alkaline Phosphatase: 68 IU/L (ref 44–121)
BUN/Creatinine Ratio: 11 (ref 9–23)
BUN: 12 mg/dL (ref 6–24)
Bilirubin Total: 0.3 mg/dL (ref 0.0–1.2)
CO2: 23 mmol/L (ref 20–29)
Calcium: 9.3 mg/dL (ref 8.7–10.2)
Chloride: 103 mmol/L (ref 96–106)
Creatinine, Ser: 1.07 mg/dL — ABNORMAL HIGH (ref 0.57–1.00)
Globulin, Total: 2.5 g/dL (ref 1.5–4.5)
Glucose: 99 mg/dL (ref 70–99)
Potassium: 4.2 mmol/L (ref 3.5–5.2)
Sodium: 140 mmol/L (ref 134–144)
Total Protein: 6.7 g/dL (ref 6.0–8.5)
eGFR: 67 mL/min/{1.73_m2} (ref >59)

## 2023-04-18 LAB — LIPID PANEL
Chol/HDL Ratio: 4.1 ratio (ref 0.0–4.4)
Cholesterol, Total: 204 mg/dL — ABNORMAL HIGH (ref 100–199)
HDL: 50 mg/dL (ref >39)
LDL Chol Calc (NIH): 125 mg/dL — ABNORMAL HIGH (ref 0–99)
Triglycerides: 166 mg/dL — ABNORMAL HIGH (ref 0–149)
VLDL Cholesterol Cal: 29 mg/dL (ref 5–40)

## 2023-04-20 ENCOUNTER — Other Ambulatory Visit: Payer: Self-pay | Admitting: Nurse Practitioner

## 2023-04-20 ENCOUNTER — Other Ambulatory Visit: Payer: Self-pay

## 2023-04-20 DIAGNOSIS — J4540 Moderate persistent asthma, uncomplicated: Secondary | ICD-10-CM

## 2023-04-20 MED ORDER — ROSUVASTATIN CALCIUM 10 MG PO TABS: 10.0000 mg | ORAL_TABLET | Freq: Every day | ORAL | 11 refills | Status: DC

## 2023-04-20 MED ORDER — WIXELA INHUB 100-50 MCG/ACT IN AEPB: 1.0000 | INHALATION_SPRAY | Freq: Two times a day (BID) | RESPIRATORY_TRACT | 0 refills | Status: DC

## 2023-04-21 ENCOUNTER — Other Ambulatory Visit: Payer: Self-pay

## 2023-04-21 DIAGNOSIS — J4540 Moderate persistent asthma, uncomplicated: Secondary | ICD-10-CM

## 2023-04-21 MED ORDER — WIXELA INHUB 100-50 MCG/ACT IN AEPB
1.0000 | INHALATION_SPRAY | Freq: Two times a day (BID) | RESPIRATORY_TRACT | 0 refills | Status: DC
Start: 2023-04-21 — End: 2023-07-17

## 2023-05-15 ENCOUNTER — Ambulatory Visit
Admission: RE | Admit: 2023-05-15 | Discharge: 2023-05-15 | Disposition: A | Discharge status: Home / Self Care | Payer: No Typology Code available for payment source | Source: Ambulatory Visit | Attending: Internal Medicine | Admitting: Internal Medicine

## 2023-05-15 ENCOUNTER — Other Ambulatory Visit: Payer: Self-pay | Admitting: Internal Medicine

## 2023-05-15 DIAGNOSIS — J45909 Unspecified asthma, uncomplicated: Secondary | ICD-10-CM | POA: Diagnosis not present

## 2023-05-15 DIAGNOSIS — N1831 Chronic kidney disease, stage 3a: Secondary | ICD-10-CM

## 2023-05-15 DIAGNOSIS — E079 Disorder of thyroid, unspecified: Secondary | ICD-10-CM

## 2023-05-15 DIAGNOSIS — J45901 Unspecified asthma with (acute) exacerbation: Secondary | ICD-10-CM

## 2023-05-15 DIAGNOSIS — N189 Chronic kidney disease, unspecified: Secondary | ICD-10-CM | POA: Diagnosis not present

## 2023-05-16 ENCOUNTER — Other Ambulatory Visit: Payer: Self-pay | Admitting: Nephrology

## 2023-05-16 DIAGNOSIS — N2889 Other specified disorders of kidney and ureter: Secondary | ICD-10-CM

## 2023-05-19 ENCOUNTER — Encounter: Payer: Self-pay | Admitting: Nephrology

## 2023-05-23 ENCOUNTER — Encounter: Payer: Self-pay | Admitting: Nephrology

## 2023-05-24 ENCOUNTER — Encounter: Payer: Self-pay | Admitting: Nephrology

## 2023-05-26 ENCOUNTER — Ambulatory Visit
Admission: RE | Admit: 2023-05-26 | Discharge: 2023-05-26 | Disposition: A | Discharge status: Home / Self Care | Payer: 59 | Source: Ambulatory Visit | Attending: Nephrology | Admitting: Nephrology

## 2023-05-26 DIAGNOSIS — N2889 Other specified disorders of kidney and ureter: Secondary | ICD-10-CM

## 2023-05-26 DIAGNOSIS — N189 Chronic kidney disease, unspecified: Secondary | ICD-10-CM | POA: Diagnosis not present

## 2023-05-26 MED ORDER — GADOPICLENOL 0.5 MMOL/ML IV SOLN
7.0000 mL | Freq: Once | INTRAVENOUS | Status: AC | PRN
  Administered 2023-05-26: 7 mL via INTRAVENOUS

## 2023-06-22 ENCOUNTER — Other Ambulatory Visit: Payer: Self-pay | Admitting: Nurse Practitioner

## 2023-06-22 DIAGNOSIS — J4540 Moderate persistent asthma, uncomplicated: Secondary | ICD-10-CM

## 2023-07-17 ENCOUNTER — Other Ambulatory Visit: Payer: Self-pay | Admitting: Nurse Practitioner

## 2023-07-17 DIAGNOSIS — J4540 Moderate persistent asthma, uncomplicated: Secondary | ICD-10-CM

## 2023-07-22 ENCOUNTER — Other Ambulatory Visit: Payer: Self-pay | Admitting: Nurse Practitioner

## 2023-07-22 DIAGNOSIS — J4540 Moderate persistent asthma, uncomplicated: Secondary | ICD-10-CM

## 2023-10-17 ENCOUNTER — Ambulatory Visit (HOSPITAL_COMMUNITY)
Admission: EM | Admit: 2023-10-17 | Discharge: 2023-10-17 | Disposition: A | Discharge status: Home / Self Care | Payer: No Typology Code available for payment source | Attending: Physician Assistant | Admitting: Physician Assistant

## 2023-10-17 ENCOUNTER — Encounter (HOSPITAL_COMMUNITY): Payer: Self-pay

## 2023-10-17 DIAGNOSIS — J069 Acute upper respiratory infection, unspecified: Secondary | ICD-10-CM | POA: Diagnosis not present

## 2023-10-17 LAB — POC COVID19/FLU A&B COMBO
Covid Antigen, POC: NEGATIVE
Influenza A Antigen, POC: NEGATIVE
Influenza B Antigen, POC: NEGATIVE

## 2023-10-17 MED ORDER — ALBUTEROL SULFATE HFA 108 (90 BASE) MCG/ACT IN AERS: 1.0000 | INHALATION_SPRAY | Freq: Four times a day (QID) | RESPIRATORY_TRACT | 0 refills | Status: DC | PRN

## 2023-10-17 NOTE — Discharge Instructions (Signed)
 Your testing was negative for flu and COVID today. Based on your described symptoms and the duration of symptoms it is likely that you have a viral upper respiratory infection (often called a "cold")  Symptoms can last for 3-10 days with lingering cough and intermittent symptoms lasting weeks after that.  The goal of treatment at this time is to reduce your symptoms and discomfort   I have sent in a refill of your albuterol rescue inhaler.  You can use this as needed up to every 6 hours for shortness of breath, chest tightness, coughing spells.  You can use over the counter medications such as Dayquil/Nyquil, AlkaSeltzer formulations, etc to provide further relief of symptoms according to the manufacturer's instructions  If preferred you can use Coricidin to manage your symptoms rather than those medications mentioned above.    If your symptoms do not improve or become worse in the next 5-7 days please make an apt at the office so we can see you  Go to the ER if you begin to have more serious symptoms such as shortness of breath, trouble breathing, loss of consciousness, swelling around the eyes, high fever, severe lasting headaches, vision changes or neck pain/stiffness.

## 2023-10-17 NOTE — ED Triage Notes (Signed)
 Chief Complaint: Chills, cough w/ pain in chest and back. Slight productive cough, chills, runny nose.  Sick exposure: Yes- Her coworker was sick with pneumonia  Onset: 2 days   Prescriptions or OTC medications tried: Yes- Theraflu, Nyquil, Advil sinus     with no relief  New foods, medications, or products: Yes, recently finished antibiotics for an abscessed tooth.   Recent Travel: No

## 2023-10-17 NOTE — ED Provider Notes (Signed)
 [facility]    CSN: [id number] Arrival date & time: 10/17/23  0908      History   Chief Complaint Chief Complaint  Patient presents with   Cough   Generalized Body Aches    HPI [place] Sarinana is a 43 y.o. female.   HPI   She reports she has had chills, body aches, coughing,  She reports chest and back pain with the coughing She states her symptoms have been present for about 2 days   She  reports a few coworkers have been sick lately  She denies increased rescue inhaler use since symptoms started  Interventions:Theraflu and Nyquil, Advil congestion - denies improvement in symptoms with these    Past Medical History:  Diagnosis Date   Asthma    Thyroid disease     Patient Active Problem List   Diagnosis Date Noted   Moderate persistent asthma 04/17/2023   Thyroid disease 05/15/2008    Past Surgical History:  Procedure Laterality Date   CESAREAN SECTION     TUBAL LIGATION      OB History   No obstetric history on file.      Home Medications    Prior to Admission medications   Medication Sig Start Date End Date Taking? Authorizing Provider  albuterol (VENTOLIN HFA) 108 (90 Base) MCG/ACT inhaler Inhale 1-2 puffs into the lungs every 6 (six) hours as needed for wheezing or shortness of breath. 10/17/23  Yes Jakson Delpilar E, PA-C  montelukast (SINGULAIR) 10 MG tablet TAKE 1 TABLET(10 MG) BY MOUTH AT BEDTIME 06/22/23  Yes Ivonne Andrew, NP  rosuvastatin (CRESTOR) 10 MG tablet Take 1 tablet (10 mg total) by mouth daily. 04/20/23 04/19/24 Yes Ivonne Andrew, NP  thyroid (ARMOUR) 120 MG tablet Take 1 tablet (120 mg total) by mouth daily before breakfast. 01/18/23  Yes Ivonne Andrew, NP  Monte Fantasia INHUB 100-50 MCG/ACT AEPB INHALE 1 PUFF INTO THE LUNGS TWICE DAILY 07/23/23  Yes Ivonne Andrew, NP    Family History History reviewed. No pertinent family history.  Social History Social History   Tobacco Use   Smoking status: Every Day    Types:  Cigarettes   Smokeless tobacco: Never  Vaping Use   Vaping status: Never Used  Substance Use Topics   Alcohol use: No   Drug use: No     Allergies   Fish allergy, Aspirin, and Penicillins   Review of Systems Review of Systems  Constitutional:  Positive for chills and fatigue. Negative for fever.  HENT:  Positive for rhinorrhea and sinus pain. Negative for congestion, ear pain, sinus pressure and sore throat.   Respiratory:  Positive for cough. Negative for chest tightness, shortness of breath and wheezing.   Gastrointestinal:  Negative for diarrhea, nausea and vomiting.  Musculoskeletal:  Positive for myalgias.  Neurological:  Positive for headaches.     Physical Exam Triage Vital Signs ED Triage Vitals  Encounter Vitals Group     BP 10/17/23 1102 (!) 129/90     Systolic BP Percentile --      Diastolic BP Percentile --      Pulse Rate 10/17/23 1102 91     Resp 10/17/23 1102 18     Temp 10/17/23 1102 99 F (37.2 C)     Temp Source 10/17/23 1102 Oral     SpO2 10/17/23 1102 97 %     Weight 10/17/23 1102 150 lb (68 kg)     Height 10/17/23 1102 5\' 3"  (1.6  m)     Head Circumference --      Peak Flow --      Pain Score 10/17/23 1100 8     Pain Loc --      Pain Education --      Exclude from Growth Chart --    No data found.  Updated Vital Signs BP (!) 129/90 (BP Location: Right Arm)   Pulse 91   Temp 99 F (37.2 C) (Oral)   Resp 18   Ht 5\' 3"  (1.6 m)   Wt 150 lb (68 kg)   LMP 09/25/2023 (Approximate)   SpO2 97%   BMI 26.57 kg/m   Visual Acuity Right Eye Distance:   Left Eye Distance:   Bilateral Distance:    Right Eye Near:   Left Eye Near:    Bilateral Near:     Physical Exam Vitals reviewed.  Constitutional:      General: She is awake.     Appearance: Normal appearance. She is well-developed and well-groomed.  HENT:     Head: Normocephalic and atraumatic.     Right Ear: Hearing, tympanic membrane and ear canal normal.     Left Ear: Hearing,  tympanic membrane and ear canal normal.     Mouth/Throat:     Lips: Pink.     Mouth: Mucous membranes are moist.     Pharynx: Oropharynx is clear. Uvula midline. No pharyngeal swelling, oropharyngeal exudate, posterior oropharyngeal erythema, uvula swelling or postnasal drip.  Cardiovascular:     Rate and Rhythm: Normal rate and regular rhythm.     Pulses: Normal pulses.          Radial pulses are 2+ on the right side and 2+ on the left side.     Heart sounds: Normal heart sounds. No murmur heard.    No friction rub. No gallop.  Pulmonary:     Effort: Pulmonary effort is normal.     Breath sounds: Normal breath sounds. No decreased air movement. No decreased breath sounds, wheezing, rhonchi or rales.  Musculoskeletal:     Cervical back: Normal range of motion and neck supple.  Lymphadenopathy:     Head:     Right side of head: No submental, submandibular or preauricular adenopathy.     Left side of head: No submental, submandibular or preauricular adenopathy.     Cervical:     Right cervical: No superficial cervical adenopathy.    Left cervical: No superficial cervical adenopathy.     Upper Body:     Right upper body: No supraclavicular adenopathy.     Left upper body: No supraclavicular adenopathy.  Neurological:     Mental Status: She is alert.  Psychiatric:        Mood and Affect: Mood normal.        Behavior: Behavior normal. Behavior is cooperative.        Thought Content: Thought content normal.        Judgment: Judgment normal.      UC Treatments / Results  Labs (all labs ordered are listed, but only abnormal results are displayed) Labs Reviewed  POC COVID19/FLU A&B COMBO    EKG   Radiology No results found.  Procedures Procedures (including critical care time)  Medications Ordered in UC Medications - No data to display  Initial Impression / Assessment and Plan / UC Course  I have reviewed the triage vital signs and the nursing notes.  Pertinent labs  & imaging results that were available  during my care of the patient were reviewed by me and considered in my medical decision making (see chart for details).      Final Clinical Impressions(s) / UC Diagnoses   Final diagnoses:  Viral upper respiratory tract infection   Visit with patient indicates symptoms comprised of coughing, pain in chest and back with coughing, fatigue, runny nose, chills  for the past 2 days  congruent with acute URI that is likely viral in nature  Rapid COVID and flu testing were negative.  Results were reviewed with patient during her visit. Will send in a refill of her albuterol inhaler to help prevent asthma exacerbation from URI. Due to nature and duration of symptoms recommended treatment regimen is symptomatic relief and follow up if needed Discussed with patient the various viral and bacterial etiologies of current illness and appropriate course of treatment Discussed OTC medication options for multisymptom relief such as Dayquil/Nyquil, Theraflu, AlkaSeltzer, etc. ED and return precautions were reviewed and provided in after visit summary.  Follow-up as needed for progressing or persistent symptoms     Discharge Instructions      Your testing was negative for flu and COVID today. Based on your described symptoms and the duration of symptoms it is likely that you have a viral upper respiratory infection (often called a "cold")  Symptoms can last for 3-10 days with lingering cough and intermittent symptoms lasting weeks after that.  The goal of treatment at this time is to reduce your symptoms and discomfort   I have sent in a refill of your albuterol rescue inhaler.  You can use this as needed up to every 6 hours for shortness of breath, chest tightness, coughing spells.  You can use over the counter medications such as Dayquil/Nyquil, AlkaSeltzer formulations, etc to provide further relief of symptoms according to the manufacturer's instructions  If  preferred you can use Coricidin to manage your symptoms rather than those medications mentioned above.    If your symptoms do not improve or become worse in the next 5-7 days please make an apt at the office so we can see you  Go to the ER if you begin to have more serious symptoms such as shortness of breath, trouble breathing, loss of consciousness, swelling around the eyes, high fever, severe lasting headaches, vision changes or neck pain/stiffness.       ED Prescriptions     Medication Sig Dispense Auth. Provider   albuterol (VENTOLIN HFA) 108 (90 Base) MCG/ACT inhaler Inhale 1-2 puffs into the lungs every 6 (six) hours as needed for wheezing or shortness of breath. 8 g Tonie Elsey E, PA-C      PDMP not reviewed this encounter.   Providence Crosby, PA-C 10/17/23 1215

## 2023-10-19 ENCOUNTER — Other Ambulatory Visit: Payer: Self-pay | Admitting: Nurse Practitioner

## 2023-10-19 DIAGNOSIS — J4540 Moderate persistent asthma, uncomplicated: Secondary | ICD-10-CM

## 2023-10-23 ENCOUNTER — Other Ambulatory Visit: Payer: Self-pay

## 2023-10-23 DIAGNOSIS — J4540 Moderate persistent asthma, uncomplicated: Secondary | ICD-10-CM

## 2023-10-23 MED ORDER — FLUTICASONE-SALMETEROL 100-50 MCG/ACT IN AEPB: 1.0000 | INHALATION_SPRAY | Freq: Two times a day (BID) | RESPIRATORY_TRACT | 0 refills | Status: DC

## 2023-10-23 NOTE — Telephone Encounter (Signed)
 Copied from CRM [id number]. Topic: Clinical - Prescription Issue >> Oct 23, 2023  9:18 AM Ivette P wrote: Reason for CRM: Pt stated that she sent a refill in for [facility] INHUB 100-50 MCG/ACT AEPB   Receipt confirmed by Pharmacy:   Sig: INHALE 1 PUFF INTO THE LUNGS TWICE DAILY  Sent to pharmacy as: Monte Fantasia INHUB 100-50 MCG/ACT Aerosol Powder, Breath Activtivatede  E-Prescribing Status: Receipt confirmed by pharmacy (10/20/2023  1:24 PM EST)   Pharmacy stating they dont have it. Pls reach out to pt  Please advise [facility]

## 2023-10-27 ENCOUNTER — Other Ambulatory Visit: Payer: Self-pay

## 2023-10-27 DIAGNOSIS — J4540 Moderate persistent asthma, uncomplicated: Secondary | ICD-10-CM

## 2023-10-27 MED ORDER — MONTELUKAST SODIUM 10 MG PO TABS: 10.0000 mg | ORAL_TABLET | Freq: Every day | ORAL | 2 refills | Status: DC

## 2023-11-08 ENCOUNTER — Other Ambulatory Visit (HOSPITAL_COMMUNITY): Payer: Self-pay | Admitting: Nurse Practitioner

## 2023-11-09 NOTE — Telephone Encounter (Signed)
 Requested medication (s) are due for refill today: yes  Requested medication (s) are on the active medication list: yes  Last refill:  10/17/23  Future visit scheduled: yes  Notes to clinic:  pt prescribed this med at an Urgent Care visit= med not assigned to a protocol   Requested Prescriptions  Pending Prescriptions Disp Refills   albuterol (VENTOLIN HFA) 108 (90 Base) MCG/ACT inhaler [Pharmacy Med Name: ALBUTEROL HFA INH (200 PUFFS) 6.7GM] 6.7 g     Sig: INHALE 1 TO 2 PUFFS INTO THE LUNGS EVERY 6 HOURS AS NEEDED FOR WHEEZING OR SHORTNESS OF BREATH     There is no refill protocol information for this order

## 2024-01-07 ENCOUNTER — Other Ambulatory Visit: Payer: Self-pay | Admitting: Nurse Practitioner

## 2024-01-07 DIAGNOSIS — J4540 Moderate persistent asthma, uncomplicated: Secondary | ICD-10-CM

## 2024-01-16 ENCOUNTER — Other Ambulatory Visit: Payer: Self-pay | Admitting: Nurse Practitioner

## 2024-01-16 DIAGNOSIS — J4540 Moderate persistent asthma, uncomplicated: Secondary | ICD-10-CM

## 2024-01-16 NOTE — Telephone Encounter (Signed)
 Please advise [facility]

## 2024-02-05 ENCOUNTER — Other Ambulatory Visit: Payer: Self-pay | Admitting: Nurse Practitioner

## 2024-02-05 DIAGNOSIS — J4540 Moderate persistent asthma, uncomplicated: Secondary | ICD-10-CM

## 2024-04-15 ENCOUNTER — Ambulatory Visit (INDEPENDENT_AMBULATORY_CARE_PROVIDER_SITE_OTHER): Payer: 59 | Admitting: Nurse Practitioner

## 2024-04-15 ENCOUNTER — Encounter: Payer: Self-pay | Admitting: Nurse Practitioner

## 2024-04-15 VITALS — BP 107/75 | HR 85 | Temp 98.5°F | Wt 166.4 lb

## 2024-04-15 DIAGNOSIS — E079 Disorder of thyroid, unspecified: Secondary | ICD-10-CM | POA: Diagnosis not present

## 2024-04-15 DIAGNOSIS — Z0000 Encounter for general adult medical examination without abnormal findings: Secondary | ICD-10-CM | POA: Diagnosis not present

## 2024-04-15 DIAGNOSIS — Z13220 Encounter for screening for lipoid disorders: Secondary | ICD-10-CM

## 2024-04-15 MED ORDER — THYROID 120 MG PO TABS
120.0000 mg | ORAL_TABLET | Freq: Every day | ORAL | 2 refills | Status: DC
Start: 2024-04-15 — End: 2024-05-10

## 2024-04-15 NOTE — Progress Notes (Signed)
 Subjective   Patient ID: Tammy Miller, female    DOB: [birth year], 43 y.o.   MRN: [id number]  Chief Complaint  Patient presents with   Annual Exam    Referring provider: Oley Bascom RAMAN, NP  Tammy Miller is a 43 y.o. female with Past Medical History: Childhood: Allergy     Comment:  Pollen, aspirin, penicillin, shellfish No date: Asthma Childhood: Depression No date: Thyroid  disease   HPI  Patient presents today for a physical.  She states that she has not been taking her thyroid  medication for the past few months.  We will put in a refill for this medication and recheck thyroid  level today.  Overall patient is doing well but states that she can feel effects of not taking her thyroid  medicine. Denies f/c/s, n/v/d, hemoptysis, PND, leg swelling Denies chest pain or edema         Allergies  Allergen Reactions   Fish Allergy Anaphylaxis    Closes airway.    Aspirin    Penicillins      There is no immunization history on file for this patient.  Tobacco History: Social History   Tobacco Use  Smoking Status Every Day   Types: Cigarettes  Smokeless Tobacco Never   Ready to quit: Not Answered Counseling given: Yes   Outpatient Encounter Medications as of 04/15/2024  Medication Sig   fluticasone -salmeterol (ADVAIR) 100-50 MCG/ACT AEPB INHALE 1 PUFF INTO THE LUNGS TWICE DAILY   montelukast  (SINGULAIR ) 10 MG tablet TAKE 1 TABLET(10 MG) BY MOUTH AT BEDTIME   rosuvastatin  (CRESTOR ) 10 MG tablet Take 1 tablet (10 mg total) by mouth daily.   albuterol  (VENTOLIN  HFA) 108 (90 Base) MCG/ACT inhaler Inhale 1-2 puffs into the lungs every 6 (six) hours as needed for wheezing or shortness of breath. (Patient not taking: Reported on 04/15/2024)   thyroid  (ARMOUR) 120 MG tablet Take 1 tablet (120 mg total) by mouth daily before breakfast.   [DISCONTINUED] thyroid  (ARMOUR) 120 MG tablet Take 1 tablet (120 mg total) by mouth daily before breakfast. (Patient not taking: Reported on  04/15/2024)   No facility-administered encounter medications on file as of 04/15/2024.    Review of Systems  Review of Systems  Constitutional: Negative.   HENT: Negative.    Cardiovascular: Negative.   Gastrointestinal: Negative.   Allergic/Immunologic: Negative.   Neurological: Negative.   Psychiatric/Behavioral: Negative.       Objective:   BP 107/75   Pulse 85   Temp 98.5 F (36.9 C) (Oral)   Wt 166 lb 6.4 oz (75.5 kg)   SpO2 96%   BMI 29.48 kg/m   Wt Readings from Last 5 Encounters:  04/15/24 166 lb 6.4 oz (75.5 kg)  10/17/23 150 lb (68 kg)  04/17/23 153 lb 6.4 oz (69.6 kg)  01/13/23 162 lb 9.6 oz (73.8 kg)  01/08/23 145 lb (65.8 kg)     Physical Exam Vitals and nursing note reviewed.  Constitutional:      General: She is not in acute distress.    Appearance: She is well-developed.  Cardiovascular:     Rate and Rhythm: Normal rate and regular rhythm.  Pulmonary:     Effort: Pulmonary effort is normal.     Breath sounds: Normal breath sounds.  Neurological:     Mental Status: She is alert and oriented to person, place, and time.       Assessment & Plan:   Lipid screening -     Lipid panel  Thyroid  disease -  Thyroid ; Take 1 tablet (120 mg total) by mouth daily before breakfast.  Dispense: 90 tablet; Refill: 2 -     Thyroid  Panel With TSH  Routine adult health maintenance -     CBC -     Comprehensive metabolic panel with GFR     Return in about 1 year (around 04/15/2025) for Physical.   Bascom GORMAN Borer, NP 04/15/2024

## 2024-04-16 ENCOUNTER — Other Ambulatory Visit: Payer: Self-pay | Admitting: Nurse Practitioner

## 2024-04-16 DIAGNOSIS — J4540 Moderate persistent asthma, uncomplicated: Secondary | ICD-10-CM

## 2024-04-16 LAB — COMPREHENSIVE METABOLIC PANEL WITH GFR
ALT: 23 IU/L (ref 0–32)
AST: 23 IU/L (ref 0–40)
Albumin: 4.4 g/dL (ref 3.9–4.9)
Alkaline Phosphatase: 70 IU/L (ref 44–121)
BUN/Creatinine Ratio: 7 — ABNORMAL LOW (ref 9–23)
BUN: 12 mg/dL (ref 6–24)
Bilirubin Total: 0.5 mg/dL (ref 0.0–1.2)
CO2: 21 mmol/L (ref 20–29)
Calcium: 8.9 mg/dL (ref 8.7–10.2)
Chloride: 103 mmol/L (ref 96–106)
Creatinine, Ser: 1.71 mg/dL — ABNORMAL HIGH (ref 0.57–1.00)
Globulin, Total: 2.3 g/dL (ref 1.5–4.5)
Glucose: 72 mg/dL (ref 70–99)
Potassium: 4.3 mmol/L (ref 3.5–5.2)
Sodium: 139 mmol/L (ref 134–144)
Total Protein: 6.7 g/dL (ref 6.0–8.5)
eGFR: 38 mL/min/1.73 — ABNORMAL LOW (ref >59)

## 2024-04-16 LAB — CBC
Hematocrit: 39 % (ref 34.0–46.6)
Hemoglobin: 13 g/dL (ref 11.1–15.9)
MCH: 32.9 pg (ref 26.6–33.0)
MCHC: 33.3 g/dL (ref 31.5–35.7)
MCV: 99 fL — ABNORMAL HIGH (ref 79–97)
Platelets: 221 x10E3/uL (ref 150–450)
RBC: 3.95 x10E6/uL (ref 3.77–5.28)
RDW: 13.3 % (ref 11.7–15.4)
WBC: 6.9 x10E3/uL (ref 3.4–10.8)

## 2024-04-16 LAB — LIPID PANEL
Chol/HDL Ratio: 4.7 ratio — ABNORMAL HIGH (ref 0.0–4.4)
Cholesterol, Total: 318 mg/dL — ABNORMAL HIGH (ref 100–199)
HDL: 68 mg/dL (ref >39)
LDL Chol Calc (NIH): 221 mg/dL — ABNORMAL HIGH (ref 0–99)
Triglycerides: 156 mg/dL — ABNORMAL HIGH (ref 0–149)
VLDL Cholesterol Cal: 29 mg/dL (ref 5–40)

## 2024-04-16 LAB — THYROID PANEL WITH TSH
Free Thyroxine Index: 0.1 — ABNORMAL LOW (ref 1.2–4.9)
T3 Uptake Ratio: 14 % — ABNORMAL LOW (ref 24–39)
T4, Total: 0.9 ug/dL — CL (ref 4.5–12.0)
TSH: 69.6 u[IU]/mL — ABNORMAL HIGH (ref 0.450–4.500)

## 2024-04-17 ENCOUNTER — Ambulatory Visit: Payer: Self-pay | Admitting: Nurse Practitioner

## 2024-04-19 ENCOUNTER — Telehealth: Payer: Self-pay

## 2024-04-19 NOTE — Telephone Encounter (Signed)
 Copied from CRM [id number]. Topic: Clinical - Medication Prior Auth >> Apr 19, 2024  1:57 PM Sophia H wrote: Reason for CRM: thyroid  (ARMOUR) 120 MG tablet  Patient was advised by pharmacy that medication requires a prior authorization, please submit and advise patient once done.   Advised for medication refills can take up to 3 bus days.    [id number] - [place], [place] - [address]

## 2024-05-10 ENCOUNTER — Telehealth: Payer: Self-pay

## 2024-05-10 DIAGNOSIS — E079 Disorder of thyroid, unspecified: Secondary | ICD-10-CM

## 2024-05-10 MED ORDER — THYROID 120 MG PO TABS: 120.0000 mg | ORAL_TABLET | Freq: Every day | ORAL | 2 refills | Status: DC

## 2024-05-10 NOTE — Telephone Encounter (Signed)
 Copied from CRM [id number]. Topic: Clinical - Prescription Issue >> May 09, 2024  5:48 PM Tammy Miller wrote: Reason for CRM: thyroid  (ARMOUR) 120 MG tablet- pharmacy waiting for doctor approval for refill

## 2024-05-13 ENCOUNTER — Other Ambulatory Visit: Payer: Self-pay

## 2024-05-13 MED ORDER — ROSUVASTATIN CALCIUM 10 MG PO TABS: 10.0000 mg | ORAL_TABLET | Freq: Every day | ORAL | 11 refills | Status: AC

## 2024-05-13 NOTE — Telephone Encounter (Signed)
 Med expired. Please advise.  Start: 04/20/2023  End: 04/19/2024

## 2024-05-16 ENCOUNTER — Other Ambulatory Visit: Payer: Self-pay

## 2024-06-13 ENCOUNTER — Other Ambulatory Visit: Payer: Self-pay

## 2024-06-13 ENCOUNTER — Emergency Department (HOSPITAL_COMMUNITY)

## 2024-06-13 ENCOUNTER — Emergency Department (HOSPITAL_COMMUNITY)
Admission: EM | Admit: 2024-06-13 | Discharge: 2024-06-14 | Disposition: A | Discharge status: Home / Self Care | Attending: Emergency Medicine | Admitting: Emergency Medicine

## 2024-06-13 ENCOUNTER — Encounter (HOSPITAL_COMMUNITY): Payer: Self-pay

## 2024-06-13 DIAGNOSIS — Z7951 Long term (current) use of inhaled steroids: Secondary | ICD-10-CM | POA: Diagnosis not present

## 2024-06-13 DIAGNOSIS — R079 Chest pain, unspecified: Secondary | ICD-10-CM | POA: Diagnosis not present

## 2024-06-13 DIAGNOSIS — R062 Wheezing: Secondary | ICD-10-CM | POA: Diagnosis not present

## 2024-06-13 DIAGNOSIS — R9431 Abnormal electrocardiogram [ECG] [EKG]: Secondary | ICD-10-CM | POA: Insufficient documentation

## 2024-06-13 DIAGNOSIS — E039 Hypothyroidism, unspecified: Secondary | ICD-10-CM | POA: Insufficient documentation

## 2024-06-13 DIAGNOSIS — R0789 Other chest pain: Secondary | ICD-10-CM | POA: Diagnosis not present

## 2024-06-13 LAB — COMPREHENSIVE METABOLIC PANEL WITH GFR
ALT: 34 U/L (ref 0–44)
AST: 35 U/L (ref 15–41)
Albumin: 4.3 g/dL (ref 3.5–5.0)
Alkaline Phosphatase: 48 U/L (ref 38–126)
Anion gap: 11 (ref 5–15)
BUN: 10 mg/dL (ref 6–20)
CO2: 26 mmol/L (ref 22–32)
Calcium: 9.4 mg/dL (ref 8.9–10.3)
Chloride: 101 mmol/L (ref 98–111)
Creatinine, Ser: 1.52 mg/dL — ABNORMAL HIGH (ref 0.44–1.00)
GFR, Estimated: 44 mL/min — ABNORMAL LOW (ref >60)
Glucose, Bld: 95 mg/dL (ref 70–99)
Potassium: 4 mmol/L (ref 3.5–5.1)
Sodium: 138 mmol/L (ref 135–145)
Total Bilirubin: 0.6 mg/dL (ref 0.0–1.2)
Total Protein: 7.3 g/dL (ref 6.5–8.1)

## 2024-06-13 LAB — I-STAT CHEM 8, ED
BUN: 13 mg/dL (ref 6–20)
Calcium, Ion: 1.14 mmol/L — ABNORMAL LOW (ref 1.15–1.40)
Chloride: 104 mmol/L (ref 98–111)
Creatinine, Ser: 1.6 mg/dL — ABNORMAL HIGH (ref 0.44–1.00)
Glucose, Bld: 91 mg/dL (ref 70–99)
HCT: 38 % (ref 36.0–46.0)
Hemoglobin: 12.9 g/dL (ref 12.0–15.0)
Potassium: 4.1 mmol/L (ref 3.5–5.1)
Sodium: 139 mmol/L (ref 135–145)
TCO2: 26 mmol/L (ref 22–32)

## 2024-06-13 LAB — CBC
HCT: 39 % (ref 36.0–46.0)
Hemoglobin: 12.9 g/dL (ref 12.0–15.0)
MCH: 32.8 pg (ref 26.0–34.0)
MCHC: 33.1 g/dL (ref 30.0–36.0)
MCV: 99.2 fL (ref 80.0–100.0)
Platelets: 232 K/uL (ref 150–400)
RBC: 3.93 MIL/uL (ref 3.87–5.11)
RDW: 13.9 % (ref 11.5–15.5)
WBC: 8.5 K/uL (ref 4.0–10.5)
nRBC: 0 % (ref 0.0–0.2)

## 2024-06-13 LAB — LIPASE, BLOOD: Lipase: 52 U/L — ABNORMAL HIGH (ref 11–51)

## 2024-06-13 LAB — TROPONIN I (HIGH SENSITIVITY)
Troponin I (High Sensitivity): 3 ng/L (ref <18)
Troponin I (High Sensitivity): 2 ng/L (ref <18)

## 2024-06-13 LAB — POC URINE PREG, ED: Preg Test, Ur: NEGATIVE

## 2024-06-13 LAB — PREGNANCY, URINE: Preg Test, Ur: NEGATIVE

## 2024-06-13 MED ORDER — IPRATROPIUM-ALBUTEROL 0.5-2.5 (3) MG/3ML IN SOLN
3.0000 mL | Freq: Once | RESPIRATORY_TRACT | Status: AC
  Administered 2024-06-13: 3 mL via RESPIRATORY_TRACT
  Filled 2024-06-13: qty 3

## 2024-06-13 MED ORDER — PREDNISONE 20 MG PO TABS
60.0000 mg | ORAL_TABLET | Freq: Once | ORAL | Status: AC
  Administered 2024-06-13: 60 mg via ORAL
  Filled 2024-06-13: qty 3

## 2024-06-13 MED ORDER — PREDNISONE 10 MG PO TABS: 20.0000 mg | ORAL_TABLET | Freq: Two times a day (BID) | ORAL | 0 refills | Status: AC

## 2024-06-13 MED ORDER — ALBUTEROL SULFATE HFA 108 (90 BASE) MCG/ACT IN AERS: 1.0000 | INHALATION_SPRAY | Freq: Four times a day (QID) | RESPIRATORY_TRACT | 0 refills | Status: DC | PRN

## 2024-06-13 NOTE — ED Notes (Signed)
 Patient transported to X-ray

## 2024-06-13 NOTE — ED Provider Notes (Signed)
 [place] EMERGENCY DEPARTMENT AT [facility] Provider Note   CSN: [id number] Arrival date & time: 06/13/24  1640     Patient presents with: Chest Pain   Tammy Miller is a 43 y.o. female with medical history of asthma and hypothyroidism presenting to the Emergency Department for left lower chest pain.  Patient reports she has been having pain located under her left breast for the last 2 weeks.  She came in today because she says it is just not gone away.  Sometimes she endorses pain into her shoulders as well.  Patient reports frequent cough which is her diet shortness of breath, nausea, vomiting, lightheadedness and diaphoresis.    Chest Pain      Prior to Admission medications   Medication Sig Start Date End Date Taking? Authorizing Provider  albuterol  (VENTOLIN  HFA) 108 (90 Base) MCG/ACT inhaler Inhale 1-2 puffs into the lungs every 6 (six) hours as needed for wheezing or shortness of breath. 06/13/24  Yes Sharlet Dowdy, MD  predniSONE  (DELTASONE ) 10 MG tablet Take 2 tablets (20 mg total) by mouth in the morning and at bedtime for 3 days. 06/13/24 06/16/24 Yes Sharlet Dowdy, MD  albuterol  (VENTOLIN  HFA) 108 [phone] Base) MCG/ACT inhaler Inhale 1-2 puffs into the lungs every 6 (six) hours as needed for wheezing or shortness of breath. Patient not taking: Reported on 04/15/2024 10/17/23   Mecum, Erin E, PA-C  fluticasone -salmeterol (ADVAIR) 100-50 MCG/ACT AEPB INHALE 1 PUFF INTO THE LUNGS TWICE DAILY 04/17/24   Nichols, Tonya S, NP  montelukast  (SINGULAIR ) 10 MG tablet TAKE 1 TABLET(10 MG) BY MOUTH AT BEDTIME 02/05/24   Nichols, Tonya S, NP  rosuvastatin  (CRESTOR ) 10 MG tablet Take 1 tablet (10 mg total) by mouth daily. 05/13/24 05/13/25  Oley Bascom RAMAN, NP  thyroid  (ARMOUR) 120 MG tablet Take 1 tablet (120 mg total) by mouth daily before breakfast. 05/10/24   Nichols, Tonya S, NP    Allergies: Fish allergy, Aspirin, and Penicillins    Review of Systems  Cardiovascular:  Positive  for chest pain.    Updated Vital Signs BP 114/79 (BP Location: Left Arm)   Pulse 64   Temp 98.5 F (36.9 C)   Resp 17   Ht 5' 4 (1.626 m)   Wt 72.6 kg   SpO2 99%   BMI 27.46 kg/m   Physical Exam Vitals and nursing note reviewed.  Constitutional:      General: She is not in acute distress.    Appearance: She is well-developed.  HENT:     Head: Normocephalic and atraumatic.  Eyes:     Conjunctiva/sclera: Conjunctivae normal.  Cardiovascular:     Rate and Rhythm: Normal rate and regular rhythm.     Heart sounds: No murmur heard. Pulmonary:     Effort: Pulmonary effort is normal. No respiratory distress.     Breath sounds: Examination of the left-upper field reveals wheezing. Wheezing present.  Chest:     Chest wall: Tenderness (Left lower ribs, tender to palpation) present.  Abdominal:     Palpations: Abdomen is soft.     Tenderness: There is no abdominal tenderness.  Musculoskeletal:        General: No swelling.     Cervical back: Neck supple.  Skin:    General: Skin is warm and dry.     Capillary Refill: Capillary refill takes less than 2 seconds.  Neurological:     Mental Status: She is alert.     (all labs ordered are  listed, but only abnormal results are displayed) Labs Reviewed  COMPREHENSIVE METABOLIC PANEL WITH GFR - Abnormal; Notable for the following components:      Result Value   Creatinine, Ser 1.52 (*)    GFR, Estimated 44 (*)    All other components within normal limits  LIPASE, BLOOD - Abnormal; Notable for the following components:   Lipase 52 (*)    All other components within normal limits  I-STAT CHEM 8, ED - Abnormal; Notable for the following components:   Creatinine, Ser 1.60 (*)    Calcium , Ion 1.14 (*)    All other components within normal limits  CBC  PREGNANCY, URINE  POC URINE PREG, ED  TROPONIN I (HIGH SENSITIVITY)  TROPONIN I (HIGH SENSITIVITY)    EKG: EKG Interpretation Date/Time:  Thursday June 13 2024 16:50:56  EDT Ventricular Rate:  84 PR Interval:  136 QRS Duration:  74 QT Interval:  362 QTC Calculation: 427 R Axis:   83  Text Interpretation: Normal sinus rhythm T wave abnormality, consider inferolateral ischemia Abnormal ECG When compared with ECG of 08-Jan-2023 00:57, PREVIOUS ECG IS PRESENT inferior and anterolateral TWI which is not new Confirmed by Charlyn Sora [phone]) on 06/13/2024 11:03:51 PM  Radiology: [facility] Chest 2 View Result Date: 06/13/2024 CLINICAL DATA:  CP EXAM: CHEST - 2 VIEW COMPARISON:  Jan 08, 2023 FINDINGS: No focal airspace consolidation, pleural effusion, or pneumothorax. No cardiomegaly.No acute fracture or destructive lesion. Multilevel thoracic osteophytosis. Lung IMPRESSION: No acute cardiopulmonary abnormality. Electronically Signed   By: Rogelia Myers M.D.   On: 06/13/2024 18:01     Procedures   Medications Ordered in the ED  predniSONE  (DELTASONE ) tablet 60 mg (60 mg Oral Given 06/13/24 2255)  ipratropium-albuterol  (DUONEB) 0.5-2.5 (3) MG/3ML nebulizer solution 3 mL (3 mLs Nebulization Given 06/13/24 2255)                                    Medical Decision Making Patient is a 43 year old female presenting to the emergency department for left lower chest pain has been ongoing for 2 weeks as above.  On arrival, patient is hemodynamically stable and in no acute distress.  She has left lower rib tenderness on palpation and scattered wheeze in her upper lung fields.  Differential diagnosis considered but not limited to musculoskeletal pain, pneumonia, ACS, PE  EKG revealed normal sinus rhythm.  No significant ST elevations or signs of STEMI.  There are T wave inversions in anterior lateral leads which were seen on previous EKG.  Labs without significant leukocytosis, anemia, or electrolyte abnormality.  Troponin 2  Chest x-ray without consolidation or opacity concerning for pneumonia  Patient is given DuoNeb and steroids.  Patient presentation is most  concerning for acute asthma exacerbation with chest wall pain in setting of recurrent cough.  Patient reports she does not have a rescue inhaler that she has been using at home.  Patient presentation is not consistent with PE given she has no shortness of breath, tachycardia, history of hypercoagulability, or signs or symptoms of DVT, she is PERC negative.  Patient is low risk for ACS with heart score of 1 and a negative troponin.  Patient's pain is reproducible with palpation and not consistent with acute cardiac pathology.  However she was informed of her nonspecific T wave inversions on EKG and recommended to follow-up with primary care doctor for possible echo or further evaluation.  On  reevaluation, patient endorses improvement in her symptoms.  Patient's lungs are clear auscultation with resolution of her wheeze.  Patient is medically ready for discharge at this time.  She was prescribed with a prescription for an albuterol  inhaler and short course of prednisone  instructed on proper use.  Patient agreed to follow-up with her outpatient primary care doctor to review her EKG within the week.  All questions were answered, strict return precautions given, patient discharged home in stable condition.  Amount and/or Complexity of Data Reviewed External Data Reviewed: labs, radiology and ECG. Labs: ordered. Radiology: ordered and independent interpretation performed. ECG/medicine tests: ordered and independent interpretation performed.  Risk Prescription drug management.       Final diagnoses:  Chest wall pain  T wave inversion in EKG    ED Discharge Orders          Ordered    albuterol  (VENTOLIN  HFA) 108 (90 Base) MCG/ACT inhaler  Every 6 hours PRN        06/13/24 2332    predniSONE  (DELTASONE ) 10 MG tablet  2 times daily        06/13/24 2332               Sharlet Dowdy, MD 06/13/24 2352    Charlyn Sora, MD 06/14/24 0005

## 2024-06-13 NOTE — ED Triage Notes (Signed)
 Patient states she has been having chest pressure for the past 2 week that is not getting any better. Patient reports that she is having some shob, poain is under her left breast.

## 2024-06-13 NOTE — ED Provider Triage Note (Signed)
 Emergency Medicine Provider Triage Evaluation Note  Tammy Miller , a 43 y.o. female  was evaluated in triage.  Pt complains of centralized chest pain that has been present for the last 2 weeks.  Patient states she also is having some pain on the upper left quadrant and some heaviness in her shoulder started today.  She denies shortness of breath, nausea, vomiting, urinary symptoms.  Patient does have a history of asthma.  Review of Systems  Positive: Chest pain, shoulder pain Negative: Nausea, vomiting, shortness of breath  Physical Exam  BP (!) 129/90 (BP Location: Right Arm)   Pulse 82   Temp 98.2 F (36.8 C)   Resp 16   Ht 5' 4 (1.626 m)   Wt 72.6 kg   SpO2 99%   BMI 27.46 kg/m  Gen:   Awake, no distress  Resp:  Normal effort, lung sounds clear bilaterally. MSK:   Moves extremities without difficulty  Other:    Medical Decision Making  Medically screening exam initiated at 5:19 PM.  Appropriate orders placed.  Tammy Miller was informed that the remainder of the evaluation will be completed by another provider, this initial triage assessment does not replace that evaluation, and the importance of remaining in the ED until their evaluation is complete.  Chest pain workup started.  EKG, chest x-ray, labs ordered.   Tammy Marry RAMAN, PA-C 06/13/24 1721

## 2024-06-13 NOTE — Discharge Instructions (Addendum)
 You were seen today for chest pain. While you were here we monitored your vitals, preformed a physical exam, and EKG.  You are wheezing with chest wall pain, likely an asthma exacerbation.  Your symptoms improved with breathing treatment and steroids.  You also had an abnormal EKG, this is nonspecific however it is important that you discuss this with your primary care doctor to discuss if a cardiac ultrasound or any further evaluation is indicated  Things to do:  - Follow up with your primary care provider within the next 1-2 weeks - Take prednisone  twice a day for 3 days - Take 2 puffs of albuterol  for wheezing and shortness of breath - Alternate Tylenol and ibuprofen as needed for pain  Return to the emergency department if you have any new or worsening symptoms including shortness of breath, passing out, chest pain, nausea, vomiting, sweating, or if you have any other concerns.

## 2024-06-14 ENCOUNTER — Ambulatory Visit: Payer: Self-pay

## 2024-06-14 NOTE — Telephone Encounter (Signed)
 FYI Only or Action Required?: Action required by provider: Requesting information about a supplement.  Patient was last seen in primary care on 04/15/2024 by Oley Bascom RAMAN, NP.  Called Nurse Triage reporting Advice Only.   Triage Disposition: Call PCP When Office is Open  Patient/caregiver understands and will follow disposition?: Yes     Copied from CRM [id number]. Topic: Clinical - Medical Advice >> Jun 14, 2024  3:48 PM Tammy Miller wrote: Reason for CRM: found supplements for anxiety, found called [name]- take 4 tablets once a day. Pt would like to know when and how many to take. Reason for Disposition  Caller wants to use a complementary or alternative medicine  Answer Assessment - Initial Assessment Questions Patient found supplements for anxiety. Requesting information on how to take them.She states on the bottle it states to take 4 a day. She is requesting information on how often to take medication and would like a message through Mychart regarding this information.     1. NAME of MEDICINE: What medicine(s) are you calling about?     Ashwagandha 2. QUESTION: What is your question? (e.g., double dose of medicine, side effect)     Dosage  3. PRESCRIBER: Who prescribed the medicine? Reason: if prescribed by specialist, call should be referred to that group.     OTC supplement  4. SYMPTOMS: Do you have any symptoms? If Yes, ask: What symptoms are you having?  How bad are the symptoms (e.g., mild, moderate, severe)     Denies  Protocols used: Medication Question Call-A-AH

## 2024-06-16 ENCOUNTER — Telehealth: Payer: Self-pay

## 2024-06-16 NOTE — Telephone Encounter (Signed)
 [facility] called to say the resident sending over scripts was not in the system. Dr Charlyn was supervising.SABRA His NPI was notated.

## 2024-06-20 NOTE — Telephone Encounter (Signed)
Appt made kh 

## 2024-06-24 ENCOUNTER — Ambulatory Visit (INDEPENDENT_AMBULATORY_CARE_PROVIDER_SITE_OTHER): Payer: Self-pay | Admitting: Nurse Practitioner

## 2024-06-24 ENCOUNTER — Encounter: Payer: Self-pay | Admitting: Nurse Practitioner

## 2024-06-24 VITALS — BP 108/76 | HR 95 | Wt 167.0 lb

## 2024-06-24 DIAGNOSIS — R9431 Abnormal electrocardiogram [ECG] [EKG]: Secondary | ICD-10-CM

## 2024-06-24 DIAGNOSIS — F419 Anxiety disorder, unspecified: Secondary | ICD-10-CM | POA: Diagnosis not present

## 2024-06-24 MED ORDER — FLUOXETINE HCL 10 MG PO CAPS: 10.0000 mg | ORAL_CAPSULE | Freq: Every day | ORAL | 3 refills | Status: AC

## 2024-06-24 MED ORDER — HYDROXYZINE HCL 10 MG PO TABS: 10.0000 mg | ORAL_TABLET | Freq: Three times a day (TID) | ORAL | 0 refills | Status: AC | PRN

## 2024-06-24 NOTE — Progress Notes (Signed)
 Subjective   Patient ID: Female Tammy Miller, female    DOB: 1981/03/23, 43 y.o.   MRN: [id number]  Chief Complaint  Patient presents with   Hospitalization Follow-up   Insomnia    Happened a couple weeks ago, dx with PTSD, not receiving any treatment     Referring provider: Oley Tammy RAMAN, NP  Tammy Miller is a 43 y.o. female with Past Medical History: Childhood: Allergy     Comment:  Pollen, aspirin, penicillin, shellfish No date: Asthma Childhood: Depression No date: Thyroid  disease   HPI  Patient presents today for a ED follow-up.  She was seen in the ED on 06/13/2024 for chest pain.  EKG was abnormal showed T wave abnormality.  Cardiac enzymes chest x-ray was normal.  We will place referral for patient to cardiology for further evaluation.  Patient states that she is still having intermittent chest pain.  She does notice this more when she is under stress and anxiety.  She has been having a lot of anxiety recently related to her job stress and new position at work.  We will order Prozac and hydroxyzine.  We will follow-up in 1 month.  Patient declines psychiatric referral at this time.  Patient does need to have a recheck on her thyroid  but states that she just started her thyroid  medicine 2 weeks ago.  We will recheck this when she follows up in 1 month.  It was noted that patient's cholesterol was extremely elevated when checked in office.  She has started on a statin. Denies f/c/s, n/v/d, hemoptysis, PND, leg swelling Denies chest pain or edema      Allergies  Allergen Reactions   Fish Allergy Anaphylaxis    Closes airway.    Aspirin    Penicillins      There is no immunization history on file for this patient.  Tobacco History: Social History   Tobacco Use  Smoking Status Every Day   Types: Cigarettes  Smokeless Tobacco Never   Ready to quit: Not Answered Counseling given: Not Answered   Outpatient Encounter Medications as of 06/24/2024  Medication Sig    albuterol  (VENTOLIN  HFA) 108 (90 Base) MCG/ACT inhaler Inhale 1-2 puffs into the lungs every 6 (six) hours as needed for wheezing or shortness of breath.   FLUoxetine (PROZAC) 10 MG capsule Take 1 capsule (10 mg total) by mouth daily.   fluticasone -salmeterol (ADVAIR) 100-50 MCG/ACT AEPB INHALE 1 PUFF INTO THE LUNGS TWICE DAILY   hydrOXYzine (ATARAX) 10 MG tablet Take 1 tablet (10 mg total) by mouth 3 (three) times daily as needed.   montelukast  (SINGULAIR ) 10 MG tablet TAKE 1 TABLET(10 MG) BY MOUTH AT BEDTIME   rosuvastatin  (CRESTOR ) 10 MG tablet Take 1 tablet (10 mg total) by mouth daily.   thyroid  (ARMOUR) 120 MG tablet Take 1 tablet (120 mg total) by mouth daily before breakfast.   albuterol  (VENTOLIN  HFA) 108 (90 Base) MCG/ACT inhaler Inhale 1-2 puffs into the lungs every 6 (six) hours as needed for wheezing or shortness of breath. (Patient not taking: Reported on 06/24/2024)   No facility-administered encounter medications on file as of 06/24/2024.    Review of Systems  Review of Systems  Constitutional: Negative.   HENT: Negative.    Cardiovascular: Negative.   Gastrointestinal: Negative.   Allergic/Immunologic: Negative.   Neurological: Negative.      Objective:   BP 108/76 (BP Location: Left Arm, Patient Position: Sitting, Cuff Size: Normal)   Pulse 95   Wt 167 lb (  75.8 kg)   SpO2 98%   BMI 28.67 kg/m   Wt Readings from Last 5 Encounters:  06/24/24 167 lb (75.8 kg)  06/13/24 160 lb (72.6 kg)  04/15/24 166 lb 6.4 oz (75.5 kg)  10/17/23 150 lb (68 kg)  04/17/23 153 lb 6.4 oz (69.6 kg)     Physical Exam Vitals and nursing note reviewed.  Constitutional:      General: She is not in acute distress.    Appearance: She is well-developed.  Cardiovascular:     Rate and Rhythm: Normal rate and regular rhythm.  Pulmonary:     Effort: Pulmonary effort is normal.     Breath sounds: Normal breath sounds.  Neurological:     Mental Status: She is alert and oriented to  person, place, and time.       Assessment & Plan:   Abnormal EKG -     Ambulatory referral to Cardiology  Anxiety -     FLUoxetine HCl; Take 1 capsule (10 mg total) by mouth daily.  Dispense: 90 capsule; Refill: 3 -     hydrOXYzine HCl; Take 1 tablet (10 mg total) by mouth 3 (three) times daily as needed.  Dispense: 30 tablet; Refill: 0     Return in about 4 weeks (around 07/22/2024) for anxiety.    Tammy GORMAN Borer, NP 06/24/2024

## 2024-07-05 ENCOUNTER — Encounter: Payer: Self-pay | Admitting: *Deleted

## 2024-07-08 NOTE — Progress Notes (Unsigned)
 OFFICE NOTE:    Date:  07/09/2024  ID:  [name], DOB Oct 05, 1980, MRN [id number] PCP: Oley Bascom RAMAN, NP  [facility] Providers Cardiologist:  None Cardiology APP:  Lelon Hamilton T, PA-C        Moderate persistent asthma Hypothyroidism Hyperlipidemia Chronic kidney disease Depression      Discussed the use of AI scribe software for clinical note transcription with the patient, who gave verbal consent to proceed. History of Present Illness Tammy Miller is a 43 y.o. female referred by Oley Bascom RAMAN, NP for chest pain and an abnormal EKG.  Tammy Miller was seen in the emergency room 06/13/24 with chest pain. An EKG at that time demonstrated normal sinus rhythm with inferolateral T wave inversions. Of note, these are old when compared to prior EKGs. Troponin levels were negative x 2. A chest x-ray showed no acute abnormalities.   Tammy Miller had been experiencing chest pain for at least two weeks prior to Tammy Miller emergency room visit on June 13, 2024. The pain is described as a heaviness in Tammy Miller left chest under the breast, without radiation. Occasionally, Tammy Miller experiences a shooting pain down the center of Tammy Miller chest, particularly during stressful situations at work. The heaviness has since resolved, but the shooting pain persists intermittently. No pain with deep breaths or physical exertion. Tammy Miller also reports occasional palpitations, described as a fluttering sensation, but denies syncope, leg swelling, or significant shortness of breath beyond Tammy Miller asthma symptoms.  Family history is significant for Tammy Miller maternal grandmother having had congestive heart failure. No sudden cardiac death.  Socially, Tammy Miller smokes one pack of cigarettes per day since age 37 and works as a art therapist at [employer]. Tammy Miller denies alcohol and drug use.    ROS-See HPI    Studies Reviewed:       LABS Total cholesterol: 318 (04/15/2024) Triglycerides: 156 (04/15/2024) HDL: 68 (04/15/2024) LDL: 778  (04/15/2024) Creatinine: 1.6 (06/13/2024) Potassium: 4.1 (06/13/2024) Hemoglobin: 12.9 (06/13/2024) ALT: 34 (06/13/2024) eGFR: 44 (06/13/2024) TSH: 69.6 (04/15/2024)  RADIOLOGY Chest x-ray: No acute abnormality (06/13/2024)  DIAGNOSTIC EKG: Normal sinus rhythm, inferolateral T wave changes, QTc 427 ms (06/13/2024)           Physical Exam:  VS:  BP 106/70   Pulse 74   Ht 5' 4 (1.626 m)   Wt 165 lb (74.8 kg)   SpO2 96%   BMI 28.32 kg/m        Wt Readings from Last 3 Encounters:  07/09/24 165 lb (74.8 kg)  06/24/24 167 lb (75.8 kg)  06/13/24 160 lb (72.6 kg)    Constitutional:      Appearance: Healthy appearance. Not in distress.  Neck:     Vascular: No carotid bruit. JVD normal.  Pulmonary:     Breath sounds: Normal breath sounds. No wheezing. No rales.  Cardiovascular:     Normal rate. Regular rhythm.     Murmurs: There is no murmur.  Edema:    Peripheral edema absent.  Abdominal:     Palpations: Abdomen is soft.       Assessment and Plan:    Assessment & Plan Precordial chest pain Nonspecific abnormal electrocardiogram (ECG) (EKG) Intermittent chest pain with left-sided heaviness and substernal shooting pain.  Tammy Miller symptoms are possibly cardiac in nature. Risk factors include markedly elevated cholesterol and smoking history. Differential includes cardiac causes, asthma, and chest wall pain.  I have recommended coronary CTA to rule out obstructive coronary artery disease. Echocardiogram is planned to assess  for structural heart disease, including hypertrophic cardiomyopathy, due to abnormality on EKG. - Order coronary CTA to rule out obstructive coronary artery disease - Rx for Ivabradine 15 mg to be taken one hour before the CT scan - Order echocardiogram to assess for structural heart disease - Follow up with me in 3 mos. I will assign Tammy Miller to Dr. Jeffrie going forward (if Tammy Miller needs long term cardiology follow up). Pure hypercholesterolemia Markedly elevated  cholesterol levels with LDL at 221 mg/dL in August 7974. This is suspicious for familial hypercholesterolemia.   - Continue rosuvastatin  10 mg daily - Order fasting lipid panel, CMET and lipoprotein A level Stage 3b chronic kidney disease (HCC) Tammy Miller is followed by Nephrology. Stage 3b with creatinine at 1.52 mg/dL and eGFR at 44 fO/fpw/8.26 m2. Will repeat CMET prior to CCTA.        Dispo:  Return for 2-3 MONTHS WITH Shalaunda Weatherholtz, MAY USE A HEART 1ST FOLLOW UP SLOT .  Signed, Glendia Ferrier, PA-C

## 2024-07-09 ENCOUNTER — Ambulatory Visit: Attending: Physician Assistant | Admitting: Physician Assistant

## 2024-07-09 ENCOUNTER — Encounter: Payer: Self-pay | Admitting: Physician Assistant

## 2024-07-09 ENCOUNTER — Other Ambulatory Visit (HOSPITAL_COMMUNITY): Payer: Self-pay

## 2024-07-09 VITALS — BP 106/70 | HR 74 | Ht 64.0 in | Wt 165.0 lb

## 2024-07-09 DIAGNOSIS — R9431 Abnormal electrocardiogram [ECG] [EKG]: Secondary | ICD-10-CM | POA: Diagnosis not present

## 2024-07-09 DIAGNOSIS — R072 Precordial pain: Secondary | ICD-10-CM | POA: Diagnosis not present

## 2024-07-09 DIAGNOSIS — N1832 Chronic kidney disease, stage 3b: Secondary | ICD-10-CM

## 2024-07-09 DIAGNOSIS — E7800 Pure hypercholesterolemia, unspecified: Secondary | ICD-10-CM | POA: Diagnosis not present

## 2024-07-09 MED ORDER — IVABRADINE HCL 7.5 MG PO TABS
15.0000 mg | ORAL_TABLET | Freq: Once | ORAL | 0 refills | Status: AC
  Filled 2024-07-09 (×2): qty 2, 1d supply, fill #0

## 2024-07-09 NOTE — Patient Instructions (Signed)
 Medication Instructions:  Your physician recommends that you continue on your current medications as directed. Please refer to the Current Medication list given to you today.  *If you need a refill on your cardiac medications before your next appointment, please call your pharmacy*  Lab Work: COME BACK FASTING FOR:  CMET, LIPID, & LPa  If you have labs (blood work) drawn today and your tests are completely normal, you will receive your results only by: MyChart Message (if you have MyChart) OR A paper copy in the mail If you have any lab test that is abnormal or we need to change your treatment, we will call you to review the results.  Testing/Procedures: Your physician has requested that you have an echocardiogram. Echocardiography is a painless test that uses sound waves to create images of your heart. It provides your doctor with information about the size and shape of your heart and how well your heart's chambers and valves are working. This procedure takes approximately one hour. There are no restrictions for this procedure. Please do NOT wear cologne, perfume, aftershave, or lotions (deodorant is allowed). Please arrive 15 minutes prior to your appointment time.  Please note: We ask at that you not bring children with you during ultrasound (echo/ vascular) testing. Due to room size and safety concerns, children are not allowed in the ultrasound rooms during exams. Our front office staff cannot provide observation of children in our lobby area while testing is being conducted. An adult accompanying a patient to their appointment will only be allowed in the ultrasound room at the discretion of the ultrasound technician under special circumstances. We apologize for any inconvenience.  Your physician has requested that you have cardiac CT. Cardiac computed tomography (CT) is a painless test that uses an x-ray machine to take clear, detailed pictures of your heart. For further information please  visit [email or web address] Please follow instruction sheet BELOW:    Your cardiac CT will be scheduled at one of the below locations:   [facility] [address] [place], [place] [address] [phone] (Severe contrast allergies only)  OR   [facility] [address] [place], [place] [address] [phone]  OR   [facility] [address] [place], [place] [address] [phone]  OR   [facility] [address]  [place], [place] [address]  OR   [facility] [address] [place], [place] [phone]  If scheduled at [facility], please arrive at the [facility] and Children's Entrance (Entrance C2) of [facility] 30 minutes prior to test start time. You can use the FREE valet parking offered at entrance C (encouraged to control the heart rate for the test)  Proceed to the [facility] Radiology Department (first floor) to check-in and test prep.  All radiology patients and guests should use entrance C2 at [facility], accessed from [facility], even though the hospital's physical address listed is [address].  If scheduled at the Heart and Vascular Tower at [address], please enter the parking lot using the [address] entrance and use the FREE valet service at the patient drop-off area. Enter the building and check-in with registration on the main floor.  If scheduled at [facility], please arrive to the Heart and Vascular Center 15 mins early for check-in and test prep.  There is spacious parking and easy access to the radiology department from the  [facility] Heart and Vascular entrance. Please enter here and check-in with the desk attendant.   If scheduled at [facility], please arrive 30 minutes early for check-in and test prep.  Please follow these instructions carefully (unless otherwise  directed):  An IV will be required for this test and Nitroglycerin will be given.    On the Night Before the Test: Be sure to Drink plenty of water. Do not consume any caffeinated/decaffeinated beverages or chocolate 12 hours prior to your test. Do not take any antihistamines 12 hours prior to your test.   On the Day of the Test: Drink plenty of water until 1 hour prior to the test. Do not eat any food 1 hour prior to test. You may take your regular medications prior to the test.  Take Ivabradine 7.5 MG TAKING 2 TABLETS two hours prior to test. FEMALES- please wear underwire-free bra if available, avoid dresses & tight clothing       After the Test: Drink plenty of water. After receiving IV contrast, you may experience a mild flushed feeling. This is normal. On occasion, you may experience a mild rash up to 24 hours after the test. This is not dangerous. If this occurs, you can take Benadryl 25 mg, Zyrtec, Claritin, or Allegra and increase your fluid intake. (Patients taking Tikosyn should avoid Benadryl, and may take Zyrtec, Claritin, or Allegra) If you experience trouble breathing, this can be serious. If it is severe call 911 IMMEDIATELY. If it is mild, please call our office.  We will call to schedule your test 2-4 weeks out understanding that some insurance companies will need an authorization prior to the service being performed.   For more information and frequently asked questions, please visit our website : [email or web address]  For non-scheduling related questions, please contact the cardiac imaging nurse navigator should you have any questions/concerns: Cardiac Imaging Nurse Navigators Direct Office Dial: [phone]   For scheduling needs, including cancellations and rescheduling, please call Brittany, [phone].   Follow-Up: At [facility], you and your health needs are our priority.  As part of our continuing mission to provide you with  exceptional heart care, our providers are all part of one team.  This team includes your primary Cardiologist (physician) and Advanced Practice Providers or APPs (Physician Assistants and Nurse Practitioners) who all work together to provide you with the care you need, when you need it.  Your next appointment:   2-3 month(s)  Provider:   Glendia Ferrier, PA-C          We recommend signing up for the patient portal called MyChart.  Sign up information is provided on this After Visit Summary.  MyChart is used to connect with patients for Virtual Visits (Telemedicine).  Patients are able to view lab/test results, encounter notes, upcoming appointments, etc.  Non-urgent messages can be sent to your provider as well.   To learn more about what you can do with MyChart, go to [email or web address].   Other Instructions

## 2024-07-16 ENCOUNTER — Other Ambulatory Visit: Payer: Self-pay | Admitting: Nurse Practitioner

## 2024-07-16 DIAGNOSIS — J4540 Moderate persistent asthma, uncomplicated: Secondary | ICD-10-CM

## 2024-07-16 NOTE — Telephone Encounter (Signed)
 Please advise [facility]

## 2024-07-17 ENCOUNTER — Ambulatory Visit: Payer: Self-pay

## 2024-07-17 NOTE — Telephone Encounter (Signed)
 FYI Only or Action Required?: Action required by provider: medication refill request.  Patient was last seen in primary care on 06/24/2024 by Oley Bascom RAMAN, NP.  Called Nurse Triage reporting Shortness of Breath.  Symptoms began x 2 days.  Interventions attempted: Other: Rescue inhaler usage.  Symptoms are: unchanged.  Triage Disposition: See HCP Within 4 Hours (Or PCP Triage)  Patient/caregiver understands and will follow disposition?: Yes        Copied from CRM #[id number]. Topic: Clinical - Red Word Triage >> Jul 17, 2024  8:29 AM Tammy Miller wrote: Red Word that prompted transfer to Nurse Triage: Pt is waiting on inhaler refill, says she cannot breathe. Please advise Reason for Disposition  [1] MILD difficulty breathing (e.g., minimal/no SOB at rest, SOB with walking, pulse < 100) AND [2] NEW-onset or WORSE than normal  Answer Assessment - Initial Assessment Questions 1. RESPIRATORY STATUS: Describe your breathing? (e.g., wheezing, shortness of breath, unable to speak, severe coughing)      SOB-mild; she is using her rescue inhaler until refill processes for fluticasone -salmeterol.  2. ONSET: When did this breathing problem begin?      X 2 days   3. PATTERN Does the difficult breathing come and go, or has it been constant since it started?      Intermittent   4. SEVERITY: How bad is your breathing? (e.g., mild, moderate, severe)      Mild   5. RECURRENT SYMPTOM: Have you had difficulty breathing before? If Yes, ask: When was the last time? and What happened that time?      Yes, ongoing   6. CARDIAC HISTORY: Do you have any history of heart disease? (e.g., heart attack, angina, bypass surgery, angioplasty)      No   7. LUNG HISTORY: Do you have any history of lung disease?  (e.g., pulmonary embolus, asthma, emphysema)     asthma  8. CAUSE: What do you think is causing the breathing problem?      Out of refill for inhaler fluticasone -salmeterol  , asthma   9. OTHER SYMPTOMS: Do you have any other symptoms? (e.g., chest pain, cough, dizziness, fever, runny nose)   Cough noted    Refill request for fluticasone -salmeterol (ADVAIR) 100-50 MCG/ACT AEPB was sent in yesterday. Pt. Is requesting the refill be sent in asap. Pt. Stated she is currently at work and would like this done without an appointment. Please advise.  Protocols used: Breathing Difficulty-A-AH

## 2024-07-19 ENCOUNTER — Other Ambulatory Visit (HOSPITAL_COMMUNITY): Payer: Self-pay

## 2024-07-20 ENCOUNTER — Ambulatory Visit (HOSPITAL_COMMUNITY): Admission: EM | Admit: 2024-07-20 | Discharge: 2024-07-20 | Disposition: A | Discharge status: Home / Self Care

## 2024-07-20 ENCOUNTER — Encounter (HOSPITAL_COMMUNITY): Payer: Self-pay

## 2024-07-20 DIAGNOSIS — Z760 Encounter for issue of repeat prescription: Secondary | ICD-10-CM

## 2024-07-20 DIAGNOSIS — J4540 Moderate persistent asthma, uncomplicated: Secondary | ICD-10-CM

## 2024-07-20 MED ORDER — ALBUTEROL SULFATE HFA 108 (90 BASE) MCG/ACT IN AERS: 1.0000 | INHALATION_SPRAY | Freq: Four times a day (QID) | RESPIRATORY_TRACT | 1 refills | Status: DC | PRN

## 2024-07-20 MED ORDER — FLUTICASONE-SALMETEROL 100-50 MCG/ACT IN AEPB: 1.0000 | INHALATION_SPRAY | Freq: Two times a day (BID) | RESPIRATORY_TRACT | 1 refills | Status: AC

## 2024-07-20 NOTE — ED Triage Notes (Signed)
 Patient states she has been out of her long acting inhaler (Advair) x7 days and Albuterol  x 2 days.  Patient states SOB and a NP cough since last night.  Patient states she has been taking an OTC  inhaler.

## 2024-07-20 NOTE — Discharge Instructions (Signed)
 Your advair and albuterol  were refilled. Please continue to follow up with your primary care provider for management of your asthma

## 2024-07-20 NOTE — ED Provider Notes (Signed)
 [facility]    CSN: [id number] Arrival date & time: 07/20/24  9041      History   Chief Complaint Chief Complaint  Patient presents with   Shortness of Breath   Medication Refill   Cough    HPI Tammy Miller is a 43 y.o. female.    Shortness of Breath Associated symptoms: cough and wheezing   Medication Refill Cough Associated symptoms: shortness of breath and wheezing   Tammy Miller presents today with request for medication refill.  She has a history of moderate persistent asthma which is managed by primary care.  She takes Advair twice daily and uses an albuterol  inhaler as needed.  She reports that she called her primary care office on Wednesday requesting a refill.  However the refill was not sent in the practice to is closed Thursday through Sunday for Thanksgiving.  She reports that she has been out of her Advair for about a week.  She has since developed a dry cough, shortness of breath, and wheezing.  She has mild tightness in the chest at all times.  However last night while supine her cough worsened and she became short of breath and experienced wheezing.  This kept her awake throughout the night and persisted through the morning.  She states that when she got out of bed and began moving around the shortness of breath and wheezing resolved.  She denies any sick symptoms and reports that overall she feels well.  Denies fevers, chills, sinus congestion, sore throat, chest congestion, nausea, vomiting, and diarrhea.  Past Medical History:  Diagnosis Date   Allergy Childhood   Pollen, aspirin, penicillin, shellfish   Asthma    CKD (chronic kidney disease)    Nephrologist: [facility]   Depression Childhood   HLD (hyperlipidemia)    Hypothyroidism     Patient Active Problem List   Diagnosis Date Noted   Moderate persistent asthma 04/17/2023   Thyroid  disease 05/15/2008    Past Surgical History:  Procedure Laterality Date   CESAREAN SECTION     TUBAL  LIGATION     One functioning fallopian tube (hx of tubal pregnancy)    OB History   No obstetric history on file.      Home Medications    Prior to Admission medications   Medication Sig Start Date End Date Taking? Authorizing Provider  albuterol  (VENTOLIN  HFA) 108 (90 Base) MCG/ACT inhaler Inhale 1-2 puffs into the lungs every 6 (six) hours as needed for wheezing or shortness of breath. 07/20/24   Leatrice Vernell HERO, NP  FLUoxetine  (PROZAC ) 10 MG capsule Take 1 capsule (10 mg total) by mouth daily. 06/24/24   Oley Bascom RAMAN, NP  fluticasone -salmeterol (ADVAIR) 100-50 MCG/ACT AEPB Inhale 1 puff into the lungs 2 (two) times daily. 07/20/24   Leatrice Vernell HERO, NP  hydrOXYzine  (ATARAX ) 10 MG tablet Take 1 tablet (10 mg total) by mouth 3 (three) times daily as needed. 06/24/24   Oley Bascom RAMAN, NP  montelukast  (SINGULAIR ) 10 MG tablet TAKE 1 TABLET(10 MG) BY MOUTH AT BEDTIME 02/05/24   Nichols, Tonya S, NP  rosuvastatin  (CRESTOR ) 10 MG tablet Take 1 tablet (10 mg total) by mouth daily. 05/13/24 05/13/25  Oley Bascom RAMAN, NP  thyroid  (ARMOUR) 120 MG tablet Take 1 tablet (120 mg total) by mouth daily before breakfast. 05/10/24   Oley Bascom RAMAN, NP    Family History Family History  Problem Relation Age of Onset   Miscarriages / Stillbirths Mother  COPD Maternal Grandmother    Heart disease Maternal Grandmother    Varicose Veins Maternal Grandmother    Heart failure Maternal Grandmother 75 - 10   Cancer Maternal Grandfather    ADD / ADHD Son    Drug abuse Maternal Aunt    Sudden death Neg Hx     Social History Social History   Tobacco Use   Smoking status: Every Day    Current packs/day: 1.00    Average packs/day: 1 pack/day for 25.9 years (25.9 ttl pk-yrs)    Types: Cigarettes    Start date: 2000   Smokeless tobacco: Never  Vaping Use   Vaping status: Never Used  Substance Use Topics   Alcohol use: No   Drug use: No     Allergies   Fish allergy, Aspirin, and  Penicillins   Review of Systems Review of Systems  Constitutional: Negative.   HENT: Negative.    Respiratory:  Positive for cough, chest tightness, shortness of breath and wheezing.   Cardiovascular: Negative.      Physical Exam Triage Vital Signs ED Triage Vitals [07/20/24 1033]  Encounter Vitals Group     BP 106/73     Girls Systolic BP Percentile      Girls Diastolic BP Percentile      Boys Systolic BP Percentile      Boys Diastolic BP Percentile      Pulse Rate 81     Resp 16     Temp 98.5 F (36.9 C)     Temp Source Oral     SpO2 94 %     Weight      Height      Head Circumference      Peak Flow      Pain Score 0     Pain Loc      Pain Education      Exclude from Growth Chart    No data found.  Updated Vital Signs BP 106/73 (BP Location: Left Arm)   Pulse 81   Temp 98.5 F (36.9 C) (Oral)   Resp 16   LMP 07/09/2024 (Approximate)   SpO2 94%   Visual Acuity Right Eye Distance:   Left Eye Distance:   Bilateral Distance:    Right Eye Near:   Left Eye Near:    Bilateral Near:     Physical Exam Vitals and nursing note reviewed.  Constitutional:      General: She is not in acute distress.    Appearance: Normal appearance. She is normal weight. She is not toxic-appearing.  HENT:     Head: Normocephalic.     Nose: No congestion or rhinorrhea.     Mouth/Throat:     Mouth: Mucous membranes are moist.  Eyes:     Conjunctiva/sclera: Conjunctivae normal.  Cardiovascular:     Rate and Rhythm: Normal rate and regular rhythm.     Heart sounds: Normal heart sounds.  Pulmonary:     Effort: Pulmonary effort is normal. No tachypnea or respiratory distress.     Breath sounds: Normal breath sounds. No stridor. No decreased breath sounds, wheezing, rhonchi or rales.  Musculoskeletal:     Cervical back: Normal range of motion and neck supple.  Lymphadenopathy:     Cervical: No cervical adenopathy.     Right cervical: No posterior cervical adenopathy.     Left cervical: No posterior cervical adenopathy.  Skin:    General: Skin is warm and dry.  Neurological:  Mental Status: She is alert and oriented to person, place, and time.  Psychiatric:        Mood and Affect: Mood normal.        Behavior: Behavior normal.      UC Treatments / Results  Labs (all labs ordered are listed, but only abnormal results are displayed) Labs Reviewed - No data to display  EKG   Radiology No results found.  Procedures Procedures (including critical care time)  Medications Ordered in UC Medications - No data to display  Initial Impression / Assessment and Plan / UC Course  I have reviewed the triage vital signs and the nursing notes.  Pertinent labs & imaging results that were available during my care of the patient were reviewed by me and considered in my medical decision making (see chart for details).     Lung sounds clear to auscultation.  No evidence of an asthma exacerbation at this time.  No concern for acute illness as patient denies other symptoms.  Exam unremarkable.  Will refill Advair and albuterol , and patient will continue to follow-up with primary care.  She verbalized symptoms of an asthma exacerbation and agrees to seek care should this occur Final Clinical Impressions(s) / UC Diagnoses   Final diagnoses:  Moderate persistent asthma, unspecified whether complicated  Medication refill     Discharge Instructions      Your advair and albuterol  were refilled. Please continue to follow up with your primary care provider for management of your asthma      ED Prescriptions     Medication Sig Dispense Auth. Provider   albuterol  (VENTOLIN  HFA) 108 (90 Base) MCG/ACT inhaler Inhale 1-2 puffs into the lungs every 6 (six) hours as needed for wheezing or shortness of breath. 8 g Shaquel Chavous M, NP   fluticasone -salmeterol (ADVAIR) 100-50 MCG/ACT AEPB Inhale 1 puff into the lungs 2 (two) times daily. 60 each Leatrice Vernell HERO, NP      PDMP not reviewed this encounter.   Leatrice Vernell HERO, NP 07/20/24 1057

## 2024-07-25 NOTE — Telephone Encounter (Signed)
 Was sent in 07/20/24. KH

## 2024-07-29 ENCOUNTER — Encounter: Payer: Self-pay | Admitting: Nurse Practitioner

## 2024-07-29 ENCOUNTER — Ambulatory Visit: Payer: Self-pay | Admitting: Nurse Practitioner

## 2024-07-29 VITALS — BP 111/60 | HR 94 | Wt 162.8 lb

## 2024-07-29 DIAGNOSIS — G4700 Insomnia, unspecified: Secondary | ICD-10-CM | POA: Diagnosis not present

## 2024-07-29 DIAGNOSIS — E079 Disorder of thyroid, unspecified: Secondary | ICD-10-CM | POA: Diagnosis not present

## 2024-07-29 MED ORDER — ZOLPIDEM TARTRATE 5 MG PO TABS: 5.0000 mg | ORAL_TABLET | Freq: Every evening | ORAL | 1 refills | Status: DC | PRN

## 2024-07-29 NOTE — Progress Notes (Signed)
 Subjective   Patient ID: Tammy Miller, female    DOB: [birth year], 43 y.o.   MRN: [id number]  Chief Complaint  Patient presents with   Anxiety   Shortness of Breath    Intermittent, has been out of inhaler for a week   Medication Management    Hydroxyzine - would like an alternative or up dose due to ineffectiveness of helping patient sleep     Referring provider: Oley Bascom RAMAN, NP  Tammy Miller is a 43 y.o. female with Past Medical History: Childhood: Allergy     Comment:  Pollen, aspirin, penicillin, shellfish No date: Asthma No date: CKD (chronic kidney disease)     Comment:  Nephrologist: [facility] Childhood: Depression No date: HLD (hyperlipidemia) No date: Hypothyroidism   HPI  Patient presents today for follow-up visit on anxiety.  She states that Prozac  has been working well for her.  She is having insomnia at night.  She has ran out of hydroxyzine  but states that this was not helping.  We will trial Ambien .  Patient does need to have thyroid  levels rechecked as well.  She states that she has been compliant with thyroid  medication. Denies f/c/s, n/v/d, hemoptysis, PND, leg swelling Denies chest pain or edema        Allergies  Allergen Reactions   Fish Allergy Anaphylaxis    Closes airway.    Aspirin    Penicillins      There is no immunization history on file for this patient.  Tobacco History: Social History   Tobacco Use  Smoking Status Every Day   Current packs/day: 1.00   Average packs/day: 1 pack/day for 25.9 years (25.9 ttl pk-yrs)   Types: Cigarettes   Start date: 2000  Smokeless Tobacco Never   Ready to quit: Not Answered Counseling given: Not Answered   Outpatient Encounter Medications as of 07/29/2024  Medication Sig   albuterol  (VENTOLIN  HFA) 108 (90 Base) MCG/ACT inhaler Inhale 1-2 puffs into the lungs every 6 (six) hours as needed for wheezing or shortness of breath.   FLUoxetine  (PROZAC ) 10 MG capsule Take 1 capsule (10  mg total) by mouth daily.   fluticasone -salmeterol (ADVAIR) 100-50 MCG/ACT AEPB Inhale 1 puff into the lungs 2 (two) times daily.   hydrOXYzine  (ATARAX ) 10 MG tablet Take 1 tablet (10 mg total) by mouth 3 (three) times daily as needed.   montelukast  (SINGULAIR ) 10 MG tablet TAKE 1 TABLET(10 MG) BY MOUTH AT BEDTIME   rosuvastatin  (CRESTOR ) 10 MG tablet Take 1 tablet (10 mg total) by mouth daily.   thyroid  (ARMOUR) 120 MG tablet Take 1 tablet (120 mg total) by mouth daily before breakfast.   zolpidem  (AMBIEN ) 5 MG tablet Take 1 tablet (5 mg total) by mouth at bedtime as needed for sleep.   No facility-administered encounter medications on file as of 07/29/2024.    Review of Systems  Review of Systems  Constitutional: Negative.   HENT: Negative.    Cardiovascular: Negative.   Gastrointestinal: Negative.   Allergic/Immunologic: Negative.   Neurological: Negative.   Psychiatric/Behavioral: Negative.       Objective:   BP 111/60 (BP Location: Left Arm, Patient Position: Sitting, Cuff Size: Large)   Pulse 94   Wt 162 lb 12.8 oz (73.8 kg)   LMP 07/09/2024 (Approximate)   SpO2 95%   BMI 27.94 kg/m   Wt Readings from Last 5 Encounters:  07/29/24 162 lb 12.8 oz (73.8 kg)  07/09/24 165 lb (74.8 kg)  06/24/24 167 lb (75.8  kg)  06/13/24 160 lb (72.6 kg)  04/15/24 166 lb 6.4 oz (75.5 kg)     Physical Exam Vitals and nursing note reviewed.  Constitutional:      General: She is not in acute distress.    Appearance: She is well-developed.  Cardiovascular:     Rate and Rhythm: Normal rate and regular rhythm.  Pulmonary:     Effort: Pulmonary effort is normal.     Breath sounds: Normal breath sounds.  Neurological:     Mental Status: She is alert and oriented to person, place, and time.       Assessment & Plan:   Insomnia, unspecified type -     Zolpidem  Tartrate; Take 1 tablet (5 mg total) by mouth at bedtime as needed for sleep.  Dispense: 15 tablet; Refill: 1  Thyroid   disease -     Thyroid  Panel With TSH; Future     Return in about 3 months (around 10/27/2024).   Bascom GORMAN Borer, NP 07/29/2024

## 2024-07-30 ENCOUNTER — Other Ambulatory Visit (HOSPITAL_COMMUNITY): Payer: Self-pay

## 2024-07-30 ENCOUNTER — Telehealth: Payer: Self-pay

## 2024-07-30 ENCOUNTER — Other Ambulatory Visit: Payer: Self-pay

## 2024-07-30 ENCOUNTER — Telehealth: Payer: Self-pay | Admitting: Physician Assistant

## 2024-07-30 DIAGNOSIS — G4700 Insomnia, unspecified: Secondary | ICD-10-CM

## 2024-07-30 LAB — THYROID PANEL WITH TSH
Free Thyroxine Index: 1.7 (ref 1.2–4.9)
T3 Uptake Ratio: 28 % (ref 24–39)
T4, Total: 6.1 ug/dL (ref 4.5–12.0)
TSH: 0.278 u[IU]/mL — ABNORMAL LOW (ref 0.450–4.500)

## 2024-07-30 MED ORDER — IVABRADINE HCL 7.5 MG PO TABS
15.0000 mg | ORAL_TABLET | Freq: Once | ORAL | 0 refills | Status: AC
  Filled 2024-07-30: qty 2, 1d supply, fill #0

## 2024-07-30 NOTE — Telephone Encounter (Signed)
 Patient states she never picked up Rx for Ivabradine  to take prior to cardiac CT. She states the pharmacy told her they did not have it.   Resent Rx to [facility]. Patient to pickup. Patient verbalized understanding and expressed appreciation for assistance.

## 2024-07-30 NOTE — Telephone Encounter (Signed)
 zolpidem  (AMBIEN ) 5 MG tablet [[id number]]

## 2024-07-30 NOTE — Telephone Encounter (Signed)
 Left message for patient to callback.  Per last office visit note will need Ivabradine  15 mg (2 tabs) taken 2 hours prior to cardiac CT (scheduled on 08/16/24). Cash price total: $20.

## 2024-07-30 NOTE — Telephone Encounter (Signed)
 Sent to the provider. Kh

## 2024-07-30 NOTE — Telephone Encounter (Signed)
 Patient called to follow-up on medication to be taken prior to her test.

## 2024-07-30 NOTE — Telephone Encounter (Signed)
 Please advise [facility]

## 2024-07-31 ENCOUNTER — Ambulatory Visit: Payer: Self-pay | Admitting: Physician Assistant

## 2024-07-31 LAB — COMPREHENSIVE METABOLIC PANEL WITH GFR
ALT: 14 IU/L (ref 0–32)
AST: 19 IU/L (ref 0–40)
Albumin: 4.2 g/dL (ref 3.9–4.9)
Alkaline Phosphatase: 57 IU/L (ref 41–116)
BUN/Creatinine Ratio: 10 (ref 9–23)
BUN: 10 mg/dL (ref 6–24)
Bilirubin Total: 0.3 mg/dL (ref 0.0–1.2)
CO2: 22 mmol/L (ref 20–29)
Calcium: 9.3 mg/dL (ref 8.7–10.2)
Chloride: 103 mmol/L (ref 96–106)
Creatinine, Ser: 1.01 mg/dL — ABNORMAL HIGH (ref 0.57–1.00)
Globulin, Total: 2.2 g/dL (ref 1.5–4.5)
Glucose: 89 mg/dL (ref 70–99)
Potassium: 4.6 mmol/L (ref 3.5–5.2)
Sodium: 137 mmol/L (ref 134–144)
Total Protein: 6.4 g/dL (ref 6.0–8.5)
eGFR: 72 mL/min/1.73 (ref >59)

## 2024-07-31 LAB — LIPID PANEL
Chol/HDL Ratio: 2.6 ratio (ref 0.0–4.4)
Cholesterol, Total: 130 mg/dL (ref 100–199)
HDL: 50 mg/dL (ref >39)
LDL Chol Calc (NIH): 60 mg/dL (ref 0–99)
Triglycerides: 111 mg/dL (ref 0–149)
VLDL Cholesterol Cal: 20 mg/dL (ref 5–40)

## 2024-07-31 LAB — LIPOPROTEIN A (LPA): Lipoprotein (a): 13 nmol/L (ref <75.0)

## 2024-07-31 MED ORDER — ZOLPIDEM TARTRATE 5 MG PO TABS: 5.0000 mg | ORAL_TABLET | Freq: Every evening | ORAL | 1 refills | Status: AC | PRN

## 2024-08-01 ENCOUNTER — Telehealth: Payer: Self-pay | Admitting: Nurse Practitioner

## 2024-08-01 ENCOUNTER — Other Ambulatory Visit: Payer: Self-pay | Admitting: Nurse Practitioner

## 2024-08-01 ENCOUNTER — Ambulatory Visit: Payer: Self-pay | Admitting: Nurse Practitioner

## 2024-08-01 MED ORDER — THYROID 90 MG PO TABS: 90.0000 mg | ORAL_TABLET | Freq: Every day | ORAL | 2 refills | Status: AC

## 2024-08-01 NOTE — Telephone Encounter (Signed)
 Copied from CRM #[id number]. Topic: Clinical - Medication Prior Auth >> Aug 01, 2024  9:41 AM Tobias CROME wrote: Reason for CRM: Patient informed by pharmacy a prior authorization is needed for ambien  prescription.

## 2024-08-02 ENCOUNTER — Telehealth: Payer: Self-pay

## 2024-08-02 NOTE — Telephone Encounter (Signed)
 Pharmacy Patient Advocate Encounter  Received notification from [employer] that Prior Authorization for ZOLPIDEM  has been APPROVED from 08/01/2024 to 01/28/2025   PA #/Case ID/Reference #: [id number]

## 2024-08-08 ENCOUNTER — Ambulatory Visit (HOSPITAL_COMMUNITY)
Admission: RE | Admit: 2024-08-08 | Discharge: 2024-08-08 | Disposition: A | Discharge status: Home / Self Care | Source: Ambulatory Visit | Attending: Cardiovascular Disease | Admitting: Cardiovascular Disease

## 2024-08-08 DIAGNOSIS — E7800 Pure hypercholesterolemia, unspecified: Secondary | ICD-10-CM | POA: Insufficient documentation

## 2024-08-08 DIAGNOSIS — N1832 Chronic kidney disease, stage 3b: Secondary | ICD-10-CM | POA: Insufficient documentation

## 2024-08-08 DIAGNOSIS — R072 Precordial pain: Secondary | ICD-10-CM | POA: Insufficient documentation

## 2024-08-08 DIAGNOSIS — R9431 Abnormal electrocardiogram [ECG] [EKG]: Secondary | ICD-10-CM | POA: Insufficient documentation

## 2024-08-08 LAB — ECHOCARDIOGRAM COMPLETE
Area-P 1/2: 5.06 cm2
S' Lateral: 2.8 cm

## 2024-08-13 ENCOUNTER — Encounter (HOSPITAL_COMMUNITY): Payer: Self-pay

## 2024-08-16 ENCOUNTER — Ambulatory Visit (HOSPITAL_COMMUNITY)
Admission: RE | Admit: 2024-08-16 | Discharge: 2024-08-16 | Disposition: A | Discharge status: Home / Self Care | Source: Ambulatory Visit | Attending: Physician Assistant | Admitting: Physician Assistant

## 2024-08-16 DIAGNOSIS — R9431 Abnormal electrocardiogram [ECG] [EKG]: Secondary | ICD-10-CM | POA: Diagnosis not present

## 2024-08-16 DIAGNOSIS — R072 Precordial pain: Secondary | ICD-10-CM | POA: Insufficient documentation

## 2024-08-16 MED ORDER — NITROGLYCERIN 0.4 MG SL SUBL
0.8000 mg | SUBLINGUAL_TABLET | Freq: Once | SUBLINGUAL | Status: AC
  Administered 2024-08-16: 0.8 mg via SUBLINGUAL

## 2024-08-16 MED ORDER — IOHEXOL 350 MG/ML SOLN
100.0000 mL | Freq: Once | INTRAVENOUS | Status: AC | PRN
  Administered 2024-08-16: 100 mL via INTRAVENOUS

## 2024-08-19 ENCOUNTER — Encounter: Payer: Self-pay | Admitting: Physician Assistant

## 2024-08-20 ENCOUNTER — Other Ambulatory Visit: Payer: Self-pay | Admitting: Nurse Practitioner

## 2024-08-20 DIAGNOSIS — J4540 Moderate persistent asthma, uncomplicated: Secondary | ICD-10-CM

## 2024-08-20 NOTE — Telephone Encounter (Signed)
 montelukast  (SINGULAIR ) 10 MG tablet [Pharmacy Med Name: MONTELUKAST  10MG  TABLETS]

## 2024-09-09 ENCOUNTER — Encounter: Payer: Self-pay | Admitting: Nurse Practitioner

## 2024-09-09 ENCOUNTER — Ambulatory Visit: Payer: Self-pay | Admitting: Nurse Practitioner

## 2024-09-09 ENCOUNTER — Ambulatory Visit: Admitting: Nurse Practitioner

## 2024-09-09 ENCOUNTER — Ambulatory Visit: Payer: Self-pay

## 2024-09-09 VITALS — BP 113/76 | HR 87 | Temp 97.6°F | Wt 164.0 lb

## 2024-09-09 DIAGNOSIS — J069 Acute upper respiratory infection, unspecified: Secondary | ICD-10-CM

## 2024-09-09 DIAGNOSIS — R051 Acute cough: Secondary | ICD-10-CM | POA: Diagnosis not present

## 2024-09-09 MED ORDER — PREDNISONE 20 MG PO TABS: 20.0000 mg | ORAL_TABLET | Freq: Every day | ORAL | 0 refills | Status: AC

## 2024-09-09 MED ORDER — BENZONATATE 200 MG PO CAPS: 200.0000 mg | ORAL_CAPSULE | Freq: Two times a day (BID) | ORAL | 0 refills | Status: AC | PRN

## 2024-09-09 MED ORDER — AZITHROMYCIN 250 MG PO TABS: ORAL_TABLET | ORAL | 0 refills | Status: AC

## 2024-09-09 NOTE — Progress Notes (Signed)
 "  Subjective   Patient ID: Tammy Miller, female    DOB: May 13, 1981, 43 y.o.   MRN: [id number]  Chief Complaint  Patient presents with   Cough    SOB x 5 days. Tried advil congestion. Declines covid and flu testing.     Referring provider: Oley Bascom RAMAN, NP  Tammy Miller is a 44 y.o. female with Past Medical History: Childhood: Allergy     Comment:  Pollen, aspirin, penicillin, shellfish No date: Asthma No date: Chest pain     Comment:  CCTA 08/16/2024: CAC score 0, no CAD No date: CKD (chronic kidney disease)     Comment:  Nephrologist: [facility] Childhood: Depression No date: HLD (hyperlipidemia) No date: Hypothyroidism   HPI  Patient presents today for sinus congestion pressure and pain for the past 5 days.  Has been coughing up productive yellow sputum.  We will trial azithromycin  and prednisone .  Patient has been having some shortness of breath associated with the symptoms.  She declines COVID and flu testing today. Denies f/c/s, n/v/d, hemoptysis, PND, leg swelling Denies chest pain or edema     Allergies[1]   There is no immunization history on file for this patient.  Tobacco History: Tobacco Use History[2] Ready to quit: Not Answered Counseling given: Not Answered   Outpatient Encounter Medications as of 09/09/2024  Medication Sig   albuterol  (VENTOLIN  HFA) 108 (90 Base) MCG/ACT inhaler Inhale 1-2 puffs into the lungs every 6 (six) hours as needed for wheezing or shortness of breath.   [EXPIRED] azithromycin  (ZITHROMAX ) 250 MG tablet Take 2 tablets on day 1, then 1 tablet daily on days 2 through 5   benzonatate  (TESSALON ) 200 MG capsule Take 1 capsule (200 mg total) by mouth 2 (two) times daily as needed for cough.   FLUoxetine  (PROZAC ) 10 MG capsule Take 1 capsule (10 mg total) by mouth daily.   fluticasone -salmeterol (ADVAIR) 100-50 MCG/ACT AEPB Inhale 1 puff into the lungs 2 (two) times daily.   hydrOXYzine  (ATARAX ) 10 MG tablet Take 1 tablet (10  mg total) by mouth 3 (three) times daily as needed.   montelukast  (SINGULAIR ) 10 MG tablet TAKE 1 TABLET(10 MG) BY MOUTH AT BEDTIME   predniSONE  (DELTASONE ) 20 MG tablet Take 1 tablet (20 mg total) by mouth daily with breakfast.   rosuvastatin  (CRESTOR ) 10 MG tablet Take 1 tablet (10 mg total) by mouth daily.   thyroid  (ARMOUR THYROID ) 90 MG tablet Take 1 tablet (90 mg total) by mouth daily.   zolpidem  (AMBIEN ) 5 MG tablet Take 1 tablet (5 mg total) by mouth at bedtime as needed for sleep.   No facility-administered encounter medications on file as of 09/09/2024.    Review of Systems  Review of Systems  Constitutional: Negative.   HENT:  Positive for congestion, sinus pressure and sinus pain.   Respiratory:  Positive for cough and shortness of breath.   Cardiovascular: Negative.   Gastrointestinal: Negative.   Allergic/Immunologic: Negative.   Neurological: Negative.   Psychiatric/Behavioral: Negative.       Objective:   BP 113/76   Pulse 87   Temp 97.6 F (36.4 C) (Temporal)   Wt 164 lb (74.4 kg)   LMP 09/05/2024   SpO2 96%   BMI 28.15 kg/m   Wt Readings from Last 5 Encounters:  09/09/24 164 lb (74.4 kg)  07/29/24 162 lb 12.8 oz (73.8 kg)  07/09/24 165 lb (74.8 kg)  06/24/24 167 lb (75.8 kg)  06/13/24 160 lb (72.6 kg)  Physical Exam Vitals and nursing note reviewed.  Constitutional:      General: She is not in acute distress.    Appearance: She is well-developed.  HENT:     Nose: Congestion present.  Cardiovascular:     Rate and Rhythm: Normal rate and regular rhythm.  Pulmonary:     Effort: Pulmonary effort is normal.     Breath sounds: Normal breath sounds.  Neurological:     Mental Status: She is alert and oriented to person, place, and time.       Assessment & Plan:   Acute cough -     Benzonatate ; Take 1 capsule (200 mg total) by mouth 2 (two) times daily as needed for cough.  Dispense: 20 capsule; Refill: 0 -     predniSONE ; Take 1 tablet  (20 mg total) by mouth daily with breakfast.  Dispense: 5 tablet; Refill: 0  Upper respiratory tract infection, unspecified type -     Azithromycin ; Take 2 tablets on day 1, then 1 tablet daily on days 2 through 5  Dispense: 6 tablet; Refill: 0 -     Benzonatate ; Take 1 capsule (200 mg total) by mouth 2 (two) times daily as needed for cough.  Dispense: 20 capsule; Refill: 0 -     predniSONE ; Take 1 tablet (20 mg total) by mouth daily with breakfast.  Dispense: 5 tablet; Refill: 0     Return if symptoms worsen or fail to improve.     Bascom GORMAN Borer, NP 09/18/2024     [1]  Allergies Allergen Reactions   Fish Allergy Anaphylaxis    Closes airway.    Aspirin    Penicillins   [2]  Social History Tobacco Use  Smoking Status Every Day   Current packs/day: 1.00   Average packs/day: 1 pack/day for 26.1 years (26.1 ttl pk-yrs)   Types: Cigarettes   Start date: 2000  Smokeless Tobacco Never   "

## 2024-09-09 NOTE — Telephone Encounter (Signed)
 FYI Only or Action Required?: FYI only for provider: appointment scheduled on 1/19.  Patient was last seen in primary care on 07/29/2024 by Oley Bascom RAMAN, NP.  Called Nurse Triage reporting Cough.  Symptoms began 5 days ago.  Interventions attempted: OTC medications: Advil cold.  Symptoms are: gradually worsening.  Triage Disposition: See Physician Within 24 Hours  Patient/caregiver understands and will follow disposition?: Yes    Reason for Triage: Patient states she has a deep productive cough with thick phlegm   Reason for Disposition  SEVERE coughing spells (e.g., whooping sound after coughing, vomiting after coughing)  Protocols used: Cough - Acute Productive-A-AH

## 2024-09-09 NOTE — Telephone Encounter (Signed)
Appt made. KH 

## 2024-09-20 ENCOUNTER — Ambulatory Visit (INDEPENDENT_AMBULATORY_CARE_PROVIDER_SITE_OTHER): Admitting: Nurse Practitioner

## 2024-09-20 ENCOUNTER — Other Ambulatory Visit: Payer: Self-pay | Admitting: Nurse Practitioner

## 2024-09-20 ENCOUNTER — Encounter: Payer: Self-pay | Admitting: Nurse Practitioner

## 2024-09-20 VITALS — BP 90/65 | HR 85 | Temp 97.8°F | Wt 165.0 lb

## 2024-09-20 DIAGNOSIS — F419 Anxiety disorder, unspecified: Secondary | ICD-10-CM | POA: Diagnosis not present

## 2024-09-20 DIAGNOSIS — E079 Disorder of thyroid, unspecified: Secondary | ICD-10-CM

## 2024-09-20 MED ORDER — ESCITALOPRAM OXALATE 20 MG PO TABS: 20.0000 mg | ORAL_TABLET | Freq: Every day | ORAL | 0 refills | Status: AC

## 2024-09-20 MED ORDER — ALBUTEROL SULFATE HFA 108 (90 BASE) MCG/ACT IN AERS: 1.0000 | INHALATION_SPRAY | Freq: Four times a day (QID) | RESPIRATORY_TRACT | 1 refills | Status: DC | PRN

## 2024-09-20 NOTE — Telephone Encounter (Signed)
 Pharmacy state not covered alternative is needed. Please advise. CB.

## 2024-09-20 NOTE — Patient Instructions (Signed)
 Escitalopram Tablets What is this medication? ESCITALOPRAM (es sye TAL oh pram) treats depression and anxiety. It increases the amount of serotonin in the brain, a hormone that helps regulate mood. It belongs to a group of medications called SSRIs. This medicine may be used for other purposes; ask your health care provider or pharmacist if you have questions. COMMON BRAND NAME(S): Lexapro What should I tell my care team before I take this medication? They need to know if you have any of these conditions: Bipolar disorder or a family history of bipolar disorder Diabetes Glaucoma Heart disease Kidney disease Liver disease Receiving electroconvulsive therapy Seizures Suicidal thoughts, plans, or attempt by you or a family member An unusual or allergic reaction to escitalopram, other medications, foods, dyes, or preservatives Pregnant or trying to become pregnant Breastfeeding How should I use this medication? Take this medication by mouth with a glass of water. Take it as directed on the prescription label at the same time every day. You can take it with or without food. If it upsets your stomach, take it with food. Do not take it more often than directed. Do not stop taking this medication suddenly except upon the advice of your care team. Stopping this medication too quickly may cause serious side effects or your condition may worsen. A special MedGuide will be given to you by the pharmacist with each prescription and refill. Be sure to read this information carefully each time. Talk to your care team about the use of this medication in children. Special care may be needed. Overdosage: If you think you have taken too much of this medicine contact a poison control center or emergency room at once. NOTE: This medicine is only for you. Do not share this medicine with others. What if I miss a dose? If you miss a dose, take it as soon as you can. If it is almost time for your next dose, take only  that dose. Do not take double or extra doses. What may interact with this medication? Do not take this medication with any of the following: Certain medications for fungal infections, such as fluconazole, itraconazole, ketoconazole, posaconazole, voriconazole Cisapride Citalopram Dronedarone Linezolid MAOIs, such as Carbex, Eldepryl, Marplan, Nardil, and Parnate Methylene blue (injected into a vein) Pimozide Thioridazine This medication may also interact with the following: Alcohol Amphetamines Aspirin and aspirin-like medications Carbamazepine Certain medications for mental health conditions Certain medications for migraine headache, such as almotriptan, eletriptan, frovatriptan, naratriptan, rizatriptan, sumatriptan, zolmitriptan Certain medications for sleep Certain medications that treat or prevent blood clots, such as warfarin, enoxaparin, dalteparin Cimetidine Diuretics Dofetilide Fentanyl Furazolidone Isoniazid Lithium Metoprolol NSAIDs, medications for pain and inflammation, such as ibuprofen or naproxen Other medications that cause heart rhythm changes Procarbazine Rasagiline Supplements, such as St. John's wort, kava kava, valerian Tramadol Tryptophan Ziprasidone This list may not describe all possible interactions. Give your health care provider a list of all the medicines, herbs, non-prescription drugs, or dietary supplements you use. Also tell them if you smoke, drink alcohol, or use illegal drugs. Some items may interact with your medicine. What should I watch for while using this medication? Tell your care team if your symptoms do not get better or if they get worse. Visit your care team for regular checks on your progress. Because it may take several weeks to see the full effects of this medication, it is important to continue your treatment as prescribed by your care team. Watch for new or worsening thoughts of suicide or depression.  This includes sudden  changes in mood, behaviors, or thoughts. These changes can happen at any time but are more common in the beginning of treatment or after a change in dose. Call your care team right away if you experience these thoughts or worsening depression. This medication may cause mood and behavior changes, such as anxiety, nervousness, irritability, hostility, restlessness, excitability, hyperactivity, or trouble sleeping. These changes can happen at any time but are more common in the beginning of treatment or after a change in dose. Call your care team right away if you notice any of these symptoms. This medication may affect your coordination, reaction time, or judgment. Do not drive or operate machinery until you know how this medication affects you. Sit up or stand slowly to reduce the risk of dizzy or fainting spells. Drinking alcohol with this medication can increase the risk of these side effects. Your mouth may get dry. Chewing sugarless gum or sucking hard candy and drinking plenty of water may help. Contact your care team if the problem does not go away or is severe. What side effects may I notice from receiving this medication? Side effects that you should report to your care team as soon as possible: Allergic reactions--skin rash, itching, hives, swelling of the face, lips, tongue, or throat Bleeding--bloody or black, tar-like stools, red or dark brown urine, vomiting blood or brown material that looks like coffee grounds, small, red or purple spots on skin, unusual bleeding or bruising Heart rhythm changes--fast or irregular heartbeat, dizziness, feeling faint or lightheaded, chest pain, trouble breathing Low sodium level--muscle weakness, fatigue, dizziness, headache, confusion Serotonin syndrome--irritability, confusion, fast or irregular heartbeat, muscle stiffness, twitching muscles, sweating, high fever, seizure, chills, vomiting, diarrhea Sudden eye pain or change in vision such as blurry vision,  seeing halos around lights, vision loss Thoughts of suicide or self-harm, worsening mood, feelings of depression Side effects that usually do not require medical attention (report to your care team if they continue or are bothersome): Change in sex drive or performance Diarrhea Excessive sweating Nausea Tremors or shaking Upset stomach This list may not describe all possible side effects. Call your doctor for medical advice about side effects. You may report side effects to FDA at 1-800-FDA-1088. Where should I keep my medication? Keep out of reach of children and pets. Store at room temperature between 15 and 30 degrees C (59 and 86 degrees F). Throw away any unused medication after the expiration date. NOTE: This sheet is a summary. It may not cover all possible information. If you have questions about this medicine, talk to your doctor, pharmacist, or health care provider.  2024 Elsevier/Gold Standard (2022-05-16 00:00:00)

## 2024-09-20 NOTE — Telephone Encounter (Signed)
 albuterol  (VENTOLIN  HFA) 108 (90 Base) MCG/ACT inhaler [Pharmacy Med Name: ALBUTEROL  HFA (PROAIR ) INHALER]      INHALE 1-2 PUFFS BY MOUTH EVERY 6 HOURS AS NEEDED FOR WHEEZE OR SHORTNESS OF BREATH  Dispense: 8.5 each     Refills: 1

## 2024-09-21 LAB — THYROID PANEL WITH TSH
Free Thyroxine Index: 0.9 — ABNORMAL LOW (ref 1.2–4.9)
T3 Uptake Ratio: 21 % — ABNORMAL LOW (ref 24–39)
T4, Total: 4.3 ug/dL — ABNORMAL LOW (ref 4.5–12.0)
TSH: 2.6 u[IU]/mL (ref 0.450–4.500)

## 2024-09-23 ENCOUNTER — Encounter: Payer: Self-pay | Admitting: *Deleted

## 2024-09-24 ENCOUNTER — Ambulatory Visit: Payer: Self-pay | Admitting: Physician Assistant

## 2024-09-24 ENCOUNTER — Encounter: Payer: Self-pay | Admitting: Physician Assistant

## 2024-09-24 VITALS — BP 102/60 | HR 83 | Ht 64.0 in | Wt 162.7 lb

## 2024-09-24 DIAGNOSIS — N1832 Chronic kidney disease, stage 3b: Secondary | ICD-10-CM | POA: Diagnosis not present

## 2024-09-24 DIAGNOSIS — R072 Precordial pain: Secondary | ICD-10-CM

## 2024-09-24 DIAGNOSIS — E7800 Pure hypercholesterolemia, unspecified: Secondary | ICD-10-CM

## 2024-09-25 ENCOUNTER — Ambulatory Visit: Payer: Self-pay | Admitting: Nurse Practitioner

## 2024-10-28 ENCOUNTER — Ambulatory Visit: Payer: Self-pay | Admitting: Nurse Practitioner

## 2025-04-17 ENCOUNTER — Ambulatory Visit: Payer: Self-pay | Admitting: Nurse Practitioner
